# Patient Record
Sex: Female | Born: 1990 | Race: White | Hispanic: No | Marital: Single | Smoking: Current some day smoker
Health system: Southern US, Community
[De-identification: ages and names within clinical notes are randomized; demographics above are authoritative.]

## PROBLEM LIST (undated history)

## (undated) ENCOUNTER — Inpatient Hospital Stay (HOSPITAL_COMMUNITY): Payer: Self-pay

## (undated) DIAGNOSIS — J45909 Unspecified asthma, uncomplicated: Secondary | ICD-10-CM

## (undated) HISTORY — PX: WISDOM TOOTH EXTRACTION: SHX21

---
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
## 2011-04-05 LAB — POCT PREGNANCY, URINE: Preg Test, Ur: NEGATIVE

## 2011-04-05 LAB — CBC
MCV: 93.4
Platelets: 218
WBC: 6.2

## 2011-04-05 LAB — GC/CHLAMYDIA PROBE AMP, GENITAL
Chlamydia, DNA Probe: NEGATIVE
GC Probe Amp, Genital: NEGATIVE

## 2011-04-05 LAB — URINALYSIS, ROUTINE W REFLEX MICROSCOPIC
Bilirubin Urine: NEGATIVE
Hgb urine dipstick: NEGATIVE
Ketones, ur: 15 — AB
Protein, ur: NEGATIVE
Urobilinogen, UA: 0.2

## 2011-04-05 LAB — WET PREP, GENITAL

## 2011-04-13 LAB — POCT PREGNANCY, URINE: Preg Test, Ur: NEGATIVE

## 2013-06-05 ENCOUNTER — Emergency Department: Payer: Self-pay | Admitting: Emergency Medicine

## 2013-06-06 ENCOUNTER — Emergency Department: Payer: Self-pay | Admitting: Emergency Medicine

## 2014-02-04 ENCOUNTER — Encounter (HOSPITAL_COMMUNITY): Payer: Self-pay | Admitting: Emergency Medicine

## 2014-02-04 ENCOUNTER — Emergency Department (HOSPITAL_COMMUNITY): Payer: Self-pay

## 2014-02-04 ENCOUNTER — Emergency Department (HOSPITAL_COMMUNITY)
Admission: EM | Admit: 2014-02-04 | Discharge: 2014-02-05 | Disposition: A | Discharge status: Home / Self Care | Payer: Self-pay | Attending: Emergency Medicine | Admitting: Emergency Medicine

## 2014-02-04 DIAGNOSIS — O99330 Smoking (tobacco) complicating pregnancy, unspecified trimester: Secondary | ICD-10-CM | POA: Insufficient documentation

## 2014-02-04 DIAGNOSIS — N390 Urinary tract infection, site not specified: Secondary | ICD-10-CM | POA: Insufficient documentation

## 2014-02-04 DIAGNOSIS — O2390 Unspecified genitourinary tract infection in pregnancy, unspecified trimester: Secondary | ICD-10-CM | POA: Insufficient documentation

## 2014-02-04 DIAGNOSIS — O209 Hemorrhage in early pregnancy, unspecified: Secondary | ICD-10-CM | POA: Insufficient documentation

## 2014-02-04 LAB — URINALYSIS, ROUTINE W REFLEX MICROSCOPIC
BILIRUBIN URINE: NEGATIVE
Glucose, UA: NEGATIVE mg/dL
KETONES UR: NEGATIVE mg/dL
NITRITE: POSITIVE — AB
PH: 6 (ref 5.0–8.0)
PROTEIN: NEGATIVE mg/dL
Specific Gravity, Urine: 1.025 (ref 1.005–1.030)
UROBILINOGEN UA: 0.2 mg/dL (ref 0.0–1.0)

## 2014-02-04 LAB — CBC WITH DIFFERENTIAL/PLATELET
BASOS ABS: 0 10*3/uL (ref 0.0–0.1)
Basophils Relative: 0 % (ref 0–1)
EOS PCT: 3 % (ref 0–5)
Eosinophils Absolute: 0.3 10*3/uL (ref 0.0–0.7)
HEMATOCRIT: 39.8 % (ref 36.0–46.0)
HEMOGLOBIN: 13.8 g/dL (ref 12.0–15.0)
LYMPHS PCT: 36 % (ref 12–46)
Lymphs Abs: 3.3 10*3/uL (ref 0.7–4.0)
MCH: 32 pg (ref 26.0–34.0)
MCHC: 34.7 g/dL (ref 30.0–36.0)
MCV: 92.3 fL (ref 78.0–100.0)
MONO ABS: 0.5 10*3/uL (ref 0.1–1.0)
MONOS PCT: 5 % (ref 3–12)
NEUTROS ABS: 5.1 10*3/uL (ref 1.7–7.7)
Neutrophils Relative %: 56 % (ref 43–77)
Platelets: 305 10*3/uL (ref 150–400)
RBC: 4.31 MIL/uL (ref 3.87–5.11)
RDW: 13.3 % (ref 11.5–15.5)
WBC: 9.2 10*3/uL (ref 4.0–10.5)

## 2014-02-04 LAB — COMPREHENSIVE METABOLIC PANEL
ALT: 13 U/L (ref 0–35)
ANION GAP: 13 (ref 5–15)
AST: 10 U/L (ref 0–37)
Albumin: 4 g/dL (ref 3.5–5.2)
Alkaline Phosphatase: 73 U/L (ref 39–117)
BUN: 14 mg/dL (ref 6–23)
CALCIUM: 9.5 mg/dL (ref 8.4–10.5)
CHLORIDE: 104 meq/L (ref 96–112)
CO2: 25 meq/L (ref 19–32)
CREATININE: 0.78 mg/dL (ref 0.50–1.10)
GLUCOSE: 99 mg/dL (ref 70–99)
Potassium: 4.4 mEq/L (ref 3.7–5.3)
Sodium: 142 mEq/L (ref 137–147)
Total Protein: 7.2 g/dL (ref 6.0–8.3)

## 2014-02-04 LAB — POC URINE PREG, ED: PREG TEST UR: POSITIVE — AB

## 2014-02-04 LAB — WET PREP, GENITAL
Trich, Wet Prep: NONE SEEN
Yeast Wet Prep HPF POC: NONE SEEN

## 2014-02-04 LAB — HCG, QUANTITATIVE, PREGNANCY: HCG, BETA CHAIN, QUANT, S: 345 m[IU]/mL — AB (ref <5)

## 2014-02-04 LAB — URINE MICROSCOPIC-ADD ON

## 2014-02-04 LAB — ABO/RH: ABO/RH(D): A POS

## 2014-02-04 NOTE — ED Notes (Signed)
Pt states that she began having heavy vaginal bleeding on Saturday. Pt states she is passing blood clots and is saturating a pad ~ q 4h. Pt is concerned that she may be pregnant. States she hasn't had a period since June but has a hx of irregular periods.

## 2014-02-04 NOTE — ED Provider Notes (Signed)
CSN: [id number]     Arrival date & time 02/04/14  1949 History   First MD Initiated Contact with Patient 02/04/14 2106     Chief Complaint  Patient presents with  . Abdominal Pain  . Vaginal Bleeding     (Consider location/radiation/quality/duration/timing/severity/associated sxs/prior Treatment) The history is provided by the patient and medical records.   This is a 23 year old female presenting to the ED for heavy vaginal bleeding beginning 3 days ago. She states she is passing blood clots and is saturating through a pad approx every 4 hours. Her last menstrual period was in June, concern for possible pregnancy as she is not currently on any form of birth control. She does have a history of irregular periods as well as ovarian cysts.  Patient does have some right lower pelvic pain as well as some nausea and vomiting, none currently.  Denies recent vaginal discharge or urinary sx.  Pt is currently sexually active with one female partner.  Denies vaginal discharge.  No prior hx STD.  Patient is not currently established with an OB-GYN.  No prior abdominal surgeries.  History reviewed. No pertinent past medical history. History reviewed. No pertinent past surgical history. No family history on file. History  Substance Use Topics  . Smoking status: Current Every Day Smoker -- 1.00 packs/day    Types: Cigarettes  . Smokeless tobacco: Never Used  . Alcohol Use: No   OB History   Grav Para Term Preterm Abortions TAB SAB Ect Mult Living                 Review of Systems  Genitourinary: Positive for vaginal bleeding and pelvic pain.  All other systems reviewed and are negative.     Allergies  Ceclor  Home Medications   Prior to Admission medications   Not on File   BP 140/78  Pulse 117  Temp(Src) 98.3 F (36.8 C) (Oral)  Resp 16  Ht 5\' 4"  (1.626 m)  Wt 120 lb (54.432 kg)  BMI 20.59 kg/m2  SpO2 100%  LMP 12/03/2013  Physical Exam  Nursing note and vitals  reviewed. Constitutional: She is oriented to person, place, and time. She appears well-developed and well-nourished. No distress.  HENT:  Head: Normocephalic and atraumatic.  Mouth/Throat: Oropharynx is clear and moist.  Eyes: Conjunctivae and EOM are normal. Pupils are equal, round, and reactive to light.  Neck: Normal range of motion. Neck supple.  Cardiovascular: Normal rate, regular rhythm and normal heart sounds.   Pulmonary/Chest: Effort normal and breath sounds normal. No respiratory distress. She has no wheezes.  Abdominal: Soft. Bowel sounds are normal. There is no tenderness. There is no guarding.  Genitourinary: Cervix exhibits no motion tenderness and no friability. Right adnexum displays tenderness (mild). Left adnexum displays no tenderness. There is bleeding around the vagina. No vaginal discharge found.  Normal female external genitalia without visible lesions; cervical os closed, mild vaginal bleeding without visible clots; no appreciable vaginal discharge noted; mild right adnexa tenderness, left adnexa non-tender; no CMT or friability noted  Musculoskeletal: Normal range of motion.  Neurological: She is alert and oriented to person, place, and time.  Skin: Skin is warm and dry. She is not diaphoretic.  Psychiatric: She has a normal mood and affect.    ED Course  Procedures (including critical care time) Labs Review Labs Reviewed  WET PREP, GENITAL - Abnormal; Notable for the following:    Clue Cells Wet Prep HPF POC RARE (*)    WBC,  Wet Prep HPF POC FEW (*)    All other components within normal limits  COMPREHENSIVE METABOLIC PANEL - Abnormal; Notable for the following:    Total Bilirubin <0.2 (*)    All other components within normal limits  URINALYSIS, ROUTINE W REFLEX MICROSCOPIC - Abnormal; Notable for the following:    APPearance CLOUDY (*)    Hgb urine dipstick LARGE (*)    Nitrite POSITIVE (*)    Leukocytes, UA TRACE (*)    All other components within  normal limits  HCG, QUANTITATIVE, PREGNANCY - Abnormal; Notable for the following:    hCG, Beta Chain, Quant, S 345 (*)    All other components within normal limits  URINE MICROSCOPIC-ADD ON - Abnormal; Notable for the following:    Bacteria, UA MANY (*)    All other components within normal limits  POC URINE PREG, ED - Abnormal; Notable for the following:    Preg Test, Ur POSITIVE (*)    All other components within normal limits  GC/CHLAMYDIA PROBE AMP  URINE CULTURE  CBC WITH DIFFERENTIAL  HIV ANTIBODY (ROUTINE TESTING)  RPR  ABO/RH    Imaging Review US Ob Comp Less 14 Wks  02/05/2014   CLINICAL DATA:  Right adnexal pain and vaginal bleeding.  EXAM: OBSTETRIC <14 WK [place] AND TRANSVAGINAL OB US  TECHNIQUE: Both transabdominal and transvaginal ultrasound examinations were performed for complete evaluation of the gestation as well as the maternal uterus, adnexal regions, and pelvic cul-de-sac. Transvaginal technique was performed to assess early pregnancy.  COMPARISON:  None.  FINDINGS: Intrauterine gestational sac:  None seen.  Yolk sac:  N/A  Embryo:  N/A  Maternal uterus/adnexae: There is thickening of the endometrial echo complex at the lower uterine segment, measuring 7 mm. This may reflect a small amount of debris. Given the patient's continued vaginal bleeding, this could reflect sequelae of spontaneous abortion.  The ovaries are unremarkable in appearance. The right ovary measures 4.0 x 2.7 x 2.1 cm, while the left ovary measures 3.8 x 2.2 x 1.9 cm. No suspicious adnexal masses are seen; there is no evidence for ovarian torsion.  Trace fluid is noted at the right adnexa.  IMPRESSION: 1. No intrauterine gestational sac seen at this time. 2. Thickening of the endometrial echo complex at the lower uterine segment, measuring 7 mm. This may reflect a small amount of debris. Given continued vaginal bleeding, this could reflect sequelae of spontaneous abortion. Would consider follow-up of  quantitative beta-hCG levels, as deemed clinically appropriate.   Electronically Signed   By: Roanna Raider M.D.   On: 02/05/2014 00:40   US Ob Transvaginal  02/05/2014   CLINICAL DATA:  Right adnexal pain and vaginal bleeding.  EXAM: OBSTETRIC <14 WK [place] AND TRANSVAGINAL OB US  TECHNIQUE: Both transabdominal and transvaginal ultrasound examinations were performed for complete evaluation of the gestation as well as the maternal uterus, adnexal regions, and pelvic cul-de-sac. Transvaginal technique was performed to assess early pregnancy.  COMPARISON:  None.  FINDINGS: Intrauterine gestational sac:  None seen.  Yolk sac:  N/A  Embryo:  N/A  Maternal uterus/adnexae: There is thickening of the endometrial echo complex at the lower uterine segment, measuring 7 mm. This may reflect a small amount of debris. Given the patient's continued vaginal bleeding, this could reflect sequelae of spontaneous abortion.  The ovaries are unremarkable in appearance. The right ovary measures 4.0 x 2.7 x 2.1 cm, while the left ovary measures 3.8 x 2.2 x 1.9 cm. No suspicious  adnexal masses are seen; there is no evidence for ovarian torsion.  Trace fluid is noted at the right adnexa.  IMPRESSION: 1. No intrauterine gestational sac seen at this time. 2. Thickening of the endometrial echo complex at the lower uterine segment, measuring 7 mm. This may reflect a small amount of debris. Given continued vaginal bleeding, this could reflect sequelae of spontaneous abortion. Would consider follow-up of quantitative beta-hCG levels, as deemed clinically appropriate.   Electronically Signed   By: Roanna Raider M.D.   On: 02/05/2014 00:40     EKG Interpretation None      MDM   Final diagnoses:  Vaginal bleeding in pregnancy, first trimester  UTI (lower urinary tract infection)   23 y.o. F with vaginal bleeding.  Abdominal exam benign.  u-preg positive (G1P0).  On pelvic exam, she does have some mild bleeding as well as right adnexal  tenderness, cervical os closed at this time.  Gestational sac not visualized at this time, quant 345 today.  U/a nitrite +, culture pending.  Wet prep rare clue cells, do not feel this indicates true infection.  Gc/Chl, RPR, and HIV pending at this time.  Patient blood type A+, Rhogam not indicated.  Will discharge home with keflex for UTI.  Encouraged to start pre-natal vitamins.  [facility]  Discussed plan with patient, he/she acknowledged understanding and agreed with plan of care.  Return precautions given for new or worsening symptoms.  NOTE:  Delayed chart completion and discharge instructions done on paper forms due to EPIC down-time.  Garlon Hatchet, PA-C 02/05/14 1057

## 2014-02-04 NOTE — ED Notes (Signed)
US at bedside

## 2014-02-04 NOTE — ED Notes (Signed)
Pt states she started having heavy vaginal bleeding Saturday night, states having dark red blood clots or "skin" coming from vaginal area, states is not on birth control and not using condoms so is a chance she could be pregnant, states last menstrual cycle was 6/9, states does have irregular periods, states pain is better laying down, having RLQ pelvic pain, pt states is feeling weak, states weakness started last night, states last week had some vomiting in morning, denies diarrhea.

## 2014-02-05 LAB — GC/CHLAMYDIA PROBE AMP
CT Probe RNA: NEGATIVE
GC PROBE AMP APTIMA: NEGATIVE

## 2014-02-05 LAB — RPR

## 2014-02-05 LAB — HIV ANTIBODY (ROUTINE TESTING W REFLEX): HIV: NONREACTIVE

## 2014-02-07 LAB — URINE CULTURE

## 2014-02-07 NOTE — ED Provider Notes (Signed)
Medical screening examination/treatment/procedure(s) were performed by non-physician practitioner and as supervising physician I was immediately available for consultation/collaboration.   EKG Interpretation None        Mirian MoMatthew Gentry, MD 02/07/14 1451

## 2014-02-08 ENCOUNTER — Telehealth (HOSPITAL_BASED_OUTPATIENT_CLINIC_OR_DEPARTMENT_OTHER): Payer: Self-pay | Admitting: Emergency Medicine

## 2014-02-08 NOTE — Progress Notes (Signed)
ED Antimicrobial Stewardship Positive Culture Follow Up   Meredith Vega is an 23 y.o. female who presented to [facility] on 02/04[facility]2014 with a chief complaint of  Chief Complaint  Patient presents with  . Abdominal Pain  . Vaginal Bleeding    Recent Results (from the past 720 hour(s))  WET PREP, GENITAL     Status: Abnormal   Collection Time    02/04/14 10:10 PM      Result Value Ref Range Status   Yeast Wet Prep HPF POC NONE SEEN  NONE SEEN Final   Trich, Wet Prep NONE SEEN  NONE SEEN Final   Clue Cells Wet Prep HPF POC RARE (*) NONE SEEN Final   WBC, Wet Prep HPF POC FEW (*) NONE SEEN Final  GC/CHLAMYDIA PROBE AMP     Status: None   Collection Time    02/04/14 10:10 PM      Result Value Ref Range Status   CT Probe RNA NEGATIVE  NEGATIVE Final   GC Probe RNA NEGATIVE  NEGATIVE Final   Comment: (NOTE)                                                                                               **Normal Reference Range: Negative**          Assay performed using the Gen-Probe APTIMA COMBO2 (R) Assay.     Acceptable specimen types for this assay include APTIMA Swabs (Unisex,     endocervical, urethral, or vaginal), first void urine, and ThinPrep     liquid based cytology samples.     Performed at [facility]  URINE CULTURE     Status: None   Collection Time    02/04/14 10:19 PM      Result Value Ref Range Status   Specimen Description URINE, CLEAN CATCH   Final   Special Requests NONE   Final   Culture  Setup Time     Final   Value: 02/05/2014 05:57     Performed at [facility]   Colony Count     Final   Value: >=100,000 COLONIES/ML     Performed at [facility]   Culture     Final   Value: ESCHERICHIA COLI     Performed at [facility]   Report Status 02/07/2014 FINAL   Final   Organism ID, Bacteria ESCHERICHIA COLI   Final   Pt was dc on keflex for UTI during EPIC downtime. She does have a PCN allergy. Will change to cipro instead.    New antibiotic prescription:   Dc kelfex Cipro 500mg  PO BID x3 days  ED Provider: Anson FretMercedes Campribi, PA  Ulyses SouthwardMinh Pham, PharmD Pager: [phone] Infectious Diseases Pharmacist Phone# [phone]

## 2014-02-08 NOTE — Telephone Encounter (Signed)
Post ED Visit - Positive Culture Follow-up: Successful Patient Follow-Up  Culture assessed and recommendations reviewed by: []  Wes Dulaney, Pharm.D., BCPS []  Celedonio MiyamotoJeremy Frens, Pharm.D., BCPS []  Georgina PillionElizabeth Martin, Pharm.D., BCPS [x]  RadissonMinh Pham, [address] BoulevardPharm[address]D., BCPS, AAHIVP []  Estella HuskMichelle Turner, Pharm.D., BCPS, AAHIVP []  Red ChristiansSamson Lee, Pharm.D. []  Cassie Roseanne RenoStewart, [place]arm[place]D.  Positive urine culture  [x]  Patient discharged without antimicrobial prescription and treatment is now indicated []  Organism is resistant to prescribed ED discharge antimicrobial []  Patient with positive blood cultures  Changes discussed with ED provider: Allen DerryMercedes Camprubi-Soms PA-C New antibiotic prescription: If on Keflex, okay. If not, Cipro 500 mg PO BID x 3 days    Zeb ComfortHolland, Zenya Hickam 02/08/2014, 12:41 PM

## 2014-02-15 ENCOUNTER — Telehealth (HOSPITAL_BASED_OUTPATIENT_CLINIC_OR_DEPARTMENT_OTHER): Payer: Self-pay | Admitting: Emergency Medicine

## 2014-02-15 NOTE — Telephone Encounter (Signed)
ID verified, pt notified of + urine culture and need for changed in antitiotic treatment. RX Cipro called to [facility] [phone].

## 2016-02-09 ENCOUNTER — Emergency Department (HOSPITAL_COMMUNITY)
Admission: EM | Admit: 2016-02-09 | Discharge: 2016-02-09 | Disposition: A | Discharge status: Home / Self Care | Payer: Self-pay | Attending: Emergency Medicine | Admitting: Emergency Medicine

## 2016-02-09 ENCOUNTER — Encounter (HOSPITAL_COMMUNITY): Payer: Self-pay | Admitting: *Deleted

## 2016-02-09 DIAGNOSIS — F1910 Other psychoactive substance abuse, uncomplicated: Secondary | ICD-10-CM | POA: Insufficient documentation

## 2016-02-09 DIAGNOSIS — F1290 Cannabis use, unspecified, uncomplicated: Secondary | ICD-10-CM | POA: Insufficient documentation

## 2016-02-09 DIAGNOSIS — R45851 Suicidal ideations: Secondary | ICD-10-CM | POA: Insufficient documentation

## 2016-02-09 DIAGNOSIS — F17210 Nicotine dependence, cigarettes, uncomplicated: Secondary | ICD-10-CM | POA: Insufficient documentation

## 2016-02-09 LAB — COMPREHENSIVE METABOLIC PANEL
ALT: 21 U/L (ref 14–54)
AST: 18 U/L (ref 15–41)
Albumin: 4.3 g/dL (ref 3.5–5.0)
Alkaline Phosphatase: 69 U/L (ref 38–126)
Anion gap: 7 (ref 5–15)
BUN: 17 mg/dL (ref 6–20)
CO2: 23 mmol/L (ref 22–32)
Calcium: 9 mg/dL (ref 8.9–10.3)
Chloride: 107 mmol/L (ref 101–111)
Creatinine, Ser: 0.91 mg/dL (ref 0.44–1.00)
GFR calc Af Amer: >60 mL/min (ref >60)
GFR calc non Af Amer: >60 mL/min (ref >60)
Glucose, Bld: 88 mg/dL (ref 65–99)
Potassium: 3.7 mmol/L (ref 3.5–5.1)
Sodium: 137 mmol/L (ref 135–145)
Total Bilirubin: 0.4 mg/dL (ref 0.3–1.2)
Total Protein: 7.5 g/dL (ref 6.5–8.1)

## 2016-02-09 LAB — CBC
HCT: 38.1 % (ref 36.0–46.0)
Hemoglobin: 13.3 g/dL (ref 12.0–15.0)
MCH: 32.1 pg (ref 26.0–34.0)
MCHC: 34.9 g/dL (ref 30.0–36.0)
MCV: 92 fL (ref 78.0–100.0)
Platelets: 320 10*3/uL (ref 150–400)
RBC: 4.14 MIL/uL (ref 3.87–5.11)
RDW: 13.1 % (ref 11.5–15.5)
WBC: 14.5 10*3/uL — ABNORMAL HIGH (ref 4.0–10.5)

## 2016-02-09 LAB — ETHANOL: Alcohol, Ethyl (B): <5 mg/dL (ref <5)

## 2016-02-09 LAB — SALICYLATE LEVEL: Salicylate Lvl: <4 mg/dL (ref 2.8–30.0)

## 2016-02-09 LAB — ACETAMINOPHEN LEVEL: Acetaminophen (Tylenol), Serum: <10 ug/mL — ABNORMAL LOW (ref 10–30)
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
## 2016-02-09 NOTE — ED Provider Notes (Signed)
I assumed care of this patient from Dr. Burgess EstelleKonut at 1600.  Please see their note for further details of Hx, PE.  Briefly patient is a 25 y.o. female with a Suicidal Patient is seeking psychiatric evaluation. Per the primary providers know patient reported that she is "definately not suicidal" and that she is "trying to get my dad to pay attention to me." Pt was medically cleared and psych was consulted. They assessed the pt in the ED and cleared for DC.  Patient provided with substance abuse resources.   Safe for discharge.  Disposition: Discharge  Condition: Good  I have discussed the results, Dx and Tx plan with the patient who expressed understanding and agree(s) with the plan. Discharge instructions discussed at great length. The patient was given strict return precautions who verbalized understanding of the instructions. No further questions at time of discharge.        Nira ConnPedro Eduardo Sueo Cullen, MD 02/09/16 [phone]

## 2016-02-09 NOTE — BH Assessment (Addendum)
Tele Assessment Note   Meredith LenisHeather L Vega is a 25 y.o. female who [facility] accompanied by law enforcement due to suspected suicidality. Pt reports that her boyfriend was picked up today by law enforcement and, while there, they saw motel sticky notes on the table, where she had written "suicide notes" to various people on. Due to this discovery, pt was made to come in for an evaluation. Pt indicates that she wrote these little notes about an hour before law enforcement came. Pt states that she wrote the notes to get her boyfriend's attention, as she was "stressed to the max" with the fact that he would be going to jail for a long time. Pt adamantly denies SI, HI, AVH or any hx of either. Pt admits to daily drug use and indicates she doesn't want to quit. Pt shares that [place] [facility] [place] to fulfil her probation.   Diagnosis: Cocaine Use disorder, severe  Past Medical History: History reviewed. No pertinent past medical history.  History reviewed. No pertinent surgical history.  Family History: No family history on file.  Social History:  reports that she has been smoking Cigarettes.  She has been smoking about 1.00 pack per day. She has never used smokeless tobacco. She reports that she uses drugs, including Marijuana. She reports that she does not drink alcohol.  Additional Social History:  Alcohol / Drug Use Pain Medications: pt denies Prescriptions: pt denies Over the Counter: pt denies History of alcohol / drug use?: Yes Longest period of sobriety (when/how long): No period of sobriety. Pt doesn't want help to quit.  Substance #1 Name of Substance 1: marijuana & crack 1 - Age of First Use: 18 1 - Amount (size/oz): varies 1 - Frequency: daily 1 - Duration: ongoing 1 - Last Use / Amount: today  CIWA: CIWA-Ar BP: 141/88 Pulse Rate: 106 COWS:    PATIENT STRENGTHS: (choose at least two) Average or above average  intelligence Capable of independent living  Allergies:  Allergies  Allergen Reactions  . Ceclor [Cefaclor] Hives    Home Medications:  (Not in a hospital admission)  OB/GYN Status:  Patient's last menstrual period was 01/12/2016.  General Assessment Data Location of Assessment: [facility] TTS Assessment: In system Is this a Tele or Face-to-Face Assessment?: Tele Assessment Is this an Initial Assessment or a Re-assessment for this encounter?: Initial Assessment Marital status: Single Is patient pregnant?: No Pregnancy Status: No Living Arrangements: Spouse/significant other, Other (Comment) (in a motel) Can pt return to current living arrangement?: Yes Admission Status: Voluntary Is patient capable of signing voluntary admission?: Yes Referral Source: Self/Family/Friend Insurance type: none     Crisis Care Plan Living Arrangements: Spouse/significant other, Other (Comment) (in a motel) Name of Psychiatrist: none Name of Therapist: none  Education Status Is patient currently in school?: No  Risk to self with the past 6 months Suicidal Ideation: No Has patient been a risk to self within the past 6 months prior to admission? : No Suicidal Intent: No Has patient had any suicidal intent within the past 6 months prior to admission? : No Is patient at risk for suicide?: No Suicidal Plan?: No Has patient had any suicidal plan within the past 6 months prior to admission? : No Access to Means: No What has been your use of drugs/alcohol within the last 12 months?: see above Previous Attempts/Gestures: No Intentional Self Injurious Behavior: None Family Suicide History: Unknown Recent stressful life event(s): Other (  Comment) (boyfriend going to jail for an extended period of time) Persecutory voices/beliefs?: No Depression: Yes Substance abuse history and/or treatment for substance abuse?: No (no s/a treatment) Suicide prevention information given to non-admitted patients: Not  applicable  Risk to Others within the past 6 months Homicidal Ideation: No Does patient have any lifetime risk of violence toward others beyond the six months prior to admission? : No Thoughts of Harm to Others: No Current Homicidal Intent: No Current Homicidal Plan: No Access to Homicidal Means: No History of harm to others?: No Assessment of Violence: None Noted Does patient have access to weapons?: No Criminal Charges Pending?: No Does patient have a court date: No Is patient on probation?: Yes  Psychosis Hallucinations: None noted Delusions: None noted  Mental Status Report Appearance/Hygiene: Unremarkable Eye Contact: Good Motor Activity: Unremarkable Speech: Logical/coherent Level of Consciousness: Alert Mood: Pleasant Affect: Other (Comment) (Euthymic) Anxiety Level: None Thought Processes: Coherent, Relevant Judgement: Unimpaired Orientation: Person, Place, Time, Situation Obsessive Compulsive Thoughts/Behaviors: None  Cognitive Functioning Concentration: Normal Memory: Recent Intact, Remote Intact IQ: Average Insight: see judgement above Impulse Control: Good Appetite: Good Sleep: No Change Vegetative Symptoms: None  ADLScreening [facility] Assessment Services) Patient's cognitive ability adequate to safely complete daily activities?: Yes Patient able to express need for assistance with ADLs?: Yes Independently performs ADLs?: Yes (appropriate for developmental age)  Prior Inpatient Therapy Prior Inpatient Therapy: No  Prior Outpatient Therapy Prior Outpatient Therapy: No Does patient have an ACCT team?: No Does patient have Intensive In-House Services?  : No Does patient have [facility] services? : No Does patient have P4CC services?: No  ADL Screening (condition at time of admission) Patient's cognitive ability adequate to safely complete daily activities?: Yes Is the patient deaf or have difficulty hearing?: No Does the patient have difficulty seeing,  even when wearing glasses/contacts?: No Does the patient have difficulty concentrating, remembering, or making decisions?: No Patient able to express need for assistance with ADLs?: Yes Does the patient have difficulty dressing or bathing?: No Independently performs ADLs?: Yes (appropriate for developmental age) Does the patient have difficulty walking or climbing stairs?: No Weakness of Legs: None Weakness of Arms/Hands: None  Home Assistive Devices/Equipment Home Assistive Devices/Equipment: None  Therapy Consults (therapy consults require a physician order) PT Evaluation Needed: No OT Evalulation Needed: No SLP Evaluation Needed: No Abuse/Neglect Assessment (Assessment to be complete while patient is alone) Physical Abuse: Denies Verbal Abuse: Denies Sexual Abuse: Denies Exploitation of patient/patient's resources: Denies Self-Neglect: Denies Values / Beliefs Cultural Requests During Hospitalization: None Spiritual Requests During Hospitalization: None Consults Spiritual Care Consult Needed: No Social Work Consult Needed: No Merchant navy officerAdvance Directives (For Healthcare) Does patient have an advance directive?: No Would patient like information on creating an advanced directive?: No - patient declined information    Additional Information 1:1 In Past 12 Months?: No CIRT Risk: No Elopement Risk: No Does patient have medical clearance?: No     Disposition:  Disposition Initial Assessment Completed for this Encounter: Yes (consulted with Dr. Lucianne MussKumar) Disposition of Patient: Other dispositions (pt can be d/c with substance abuse referrals)  Laddie AquasSamantha M Kaenan Jake 02/09/2016 4:01 PM

## 2017-03-16 ENCOUNTER — Emergency Department (HOSPITAL_COMMUNITY): Payer: Self-pay

## 2017-03-16 ENCOUNTER — Emergency Department (HOSPITAL_COMMUNITY)
Admission: EM | Admit: 2017-03-16 | Discharge: 2017-03-16 | Disposition: A | Discharge status: Home / Self Care | Payer: Self-pay | Attending: Emergency Medicine | Admitting: Emergency Medicine

## 2017-03-16 DIAGNOSIS — J40 Bronchitis, not specified as acute or chronic: Secondary | ICD-10-CM

## 2017-03-16 DIAGNOSIS — R079 Chest pain, unspecified: Secondary | ICD-10-CM | POA: Insufficient documentation

## 2017-03-16 DIAGNOSIS — F17210 Nicotine dependence, cigarettes, uncomplicated: Secondary | ICD-10-CM | POA: Insufficient documentation

## 2017-03-16 LAB — BASIC METABOLIC PANEL
ANION GAP: 11 (ref 5–15)
BUN: 9 mg/dL (ref 6–20)
CO2: 21 mmol/L — ABNORMAL LOW (ref 22–32)
Calcium: 9.2 mg/dL (ref 8.9–10.3)
Chloride: 106 mmol/L (ref 101–111)
Creatinine, Ser: 0.83 mg/dL (ref 0.44–1.00)
Glucose, Bld: 141 mg/dL — ABNORMAL HIGH (ref 65–99)
Potassium: 3.6 mmol/L (ref 3.5–5.1)
Sodium: 138 mmol/L (ref 135–145)

## 2017-03-16 LAB — CBC
HCT: 41.1 % (ref 36.0–46.0)
Hemoglobin: 14.1 g/dL (ref 12.0–15.0)
MCH: 31.1 pg (ref 26.0–34.0)
MCHC: 34.3 g/dL (ref 30.0–36.0)
MCV: 90.5 fL (ref 78.0–100.0)
Platelets: 308 10*3/uL (ref 150–400)
RBC: 4.54 MIL/uL (ref 3.87–5.11)
RDW: 13.8 % (ref 11.5–15.5)
WBC: 18.8 10*3/uL — ABNORMAL HIGH (ref 4.0–10.5)

## 2017-03-16 LAB — I-STAT TROPONIN, ED: TROPONIN I, POC: 0 ng/mL (ref 0.00–0.08)

## 2017-03-16 MED ORDER — BENZONATATE 100 MG PO CAPS: 100.0000 mg | ORAL_CAPSULE | Freq: Three times a day (TID) | ORAL | 0 refills | Status: DC | PRN

## 2017-03-16 MED ORDER — ALBUTEROL SULFATE HFA 108 (90 BASE) MCG/ACT IN AERS
1.0000 | INHALATION_SPRAY | Freq: Once | RESPIRATORY_TRACT | Status: AC
  Administered 2017-03-16: 2 via RESPIRATORY_TRACT
  Filled 2017-03-16: qty 6.7

## 2017-03-16 MED ORDER — ALBUTEROL SULFATE (2.5 MG/3ML) 0.083% IN NEBU: 2.5000 mg | INHALATION_SOLUTION | Freq: Once | RESPIRATORY_TRACT | Status: DC

## 2017-03-16 MED ORDER — PREDNISONE 20 MG PO TABS: 40.0000 mg | ORAL_TABLET | Freq: Every day | ORAL | 0 refills | Status: DC

## 2017-03-16 MED ORDER — NAPROXEN 500 MG PO TABS: 500.0000 mg | ORAL_TABLET | Freq: Two times a day (BID) | ORAL | 0 refills | Status: DC | PRN

## 2017-03-16 MED ORDER — ALBUTEROL SULFATE (2.5 MG/3ML) 0.083% IN NEBU
INHALATION_SOLUTION | RESPIRATORY_TRACT | Status: AC
  Filled 2017-03-16: qty 3

## 2017-03-16 NOTE — ED Triage Notes (Signed)
Pt states for the last few days she has been having wheezing and feeling like she cant breath with sob, pt does have wheezing in the bases with labored breathing pt appears to be hyperventilating. Able to speak in complete sentences. Pt in curled in ina ball in triage chair not being very cooperative.

## 2017-03-16 NOTE — ED Provider Notes (Signed)
[facility]-EMERGENCY DEPT Provider Note   CSN: [id number] Arrival date & time: 03/16/17  1346     History   Chief Complaint Chief Complaint  Patient presents with  . Shortness of Breath    HPI Meredith Vega is a 26 y.o. female.  The history is provided by medical records. No language interpreter was used.  Shortness of Breath  Associated symptoms include cough and chest pain. Pertinent negatives include no leg swelling.   Meredith Vega is a 26 y.o. female who presents to the Emergency Department complaining of productive cough, congestion x 2 days with shortness of breath that developed this morning. Patient reports wheezing this morning as well. Nebulizer treatment given in triage and patient notes improvement following this. She endorses pain on both sides of her ribs and chest discomfort which he believes is secondary to coughing so much. No known sick contacts. She is a daily smoker. No recent surgeries/immobilizations/long travel. No hemoptysis. Not on OCPs.   No past medical history on file.  There are no active problems to display for this patient.   No past surgical history on file.  OB History    No data available       Home Medications    Prior to Admission medications   Medication Sig Start Date End Date Taking? Authorizing Provider  benzonatate (TESSALON) 100 MG capsule Take 1 capsule (100 mg total) by mouth 3 (three) times daily as needed for cough. 03/16/17   Meredith Vega, Chase Picket, PA-C  naproxen (NAPROSYN) 500 MG tablet Take 1 tablet (500 mg total) by mouth 2 (two) times daily as needed for moderate pain. 03/16/17   Meredith Vega, Chase Picket, PA-C  predniSONE (DELTASONE) 20 MG tablet Take 2 tablets (40 mg total) by mouth daily. 03/16/17   Meredith Vega, Chase Picket, PA-C    Family History No family history on file.  Social History Social History  Substance Use Topics  . Smoking status: Current Every Day Smoker    Packs/day: 1.00    Types: Cigarettes  . Smokeless  tobacco: Never Used  . Alcohol use No     Allergies   Ceclor [cefaclor]   Review of Systems Review of Systems  HENT: Positive for congestion. Negative for trouble swallowing.   Respiratory: Positive for cough and shortness of breath.   Cardiovascular: Positive for chest pain. Negative for palpitations and leg swelling.  All other systems reviewed and are negative.    Physical Exam Updated Vital Signs BP (!) 102/53 (BP Location: Left Arm)   Pulse (!) 104   Temp 98.4 F (36.9 C) (Oral)   Resp (!) 28   SpO2 94% Comment: While ambulating.  Physical Exam  Constitutional: She is oriented to person, place, and time. She appears well-developed and well-nourished. No distress.  HENT:  Head: Normocephalic and atraumatic.  Cardiovascular: Normal rate, regular rhythm and normal heart sounds.   No murmur heard. Pulmonary/Chest: Effort normal and breath sounds normal. No respiratory distress. She has no wheezes. She has no rales. She exhibits tenderness.  Abdominal: Soft. She exhibits no distension. There is no tenderness.  Musculoskeletal: She exhibits no edema.  Neurological: She is alert and oriented to person, place, and time.  Skin: Skin is warm and dry.  Nursing note and vitals reviewed.    ED Treatments / Results  Labs (all labs ordered are listed, but only abnormal results are displayed) Labs Reviewed  BASIC METABOLIC PANEL - Abnormal; Notable for the following:       Result  Value   CO2 21 (*)    Glucose, Bld 141 (*)    All other components within normal limits  CBC - Abnormal; Notable for the following:    WBC 18.8 (*)    All other components within normal limits  I-STAT TROPONIN, ED    EKG  EKG Interpretation None       Radiology Dg Chest 2 View  Result Date: 03/16/2017 CLINICAL DATA:  Shortness of breath, cough, left chest pain EXAM: CHEST  2 VIEW COMPARISON:  03/08/2017 FINDINGS: The heart size and mediastinal contours are within normal limits. Both  lungs are clear. The visualized skeletal structures are unremarkable. IMPRESSION: No active cardiopulmonary disease. Electronically Signed   By: Judie Petit.  Shick M.D.   On: 03/16/2017 15:01    Procedures Procedures (including critical care time)  Medications Ordered in ED Medications  albuterol (PROVENTIL) (2.5 MG/3ML) 0.083% nebulizer solution 2.5 mg (not administered)  albuterol (PROVENTIL HFA;VENTOLIN HFA) 108 (90 Base) MCG/ACT inhaler 1-2 puff (2 puffs Inhalation Given 03/16/17 1546)     Initial Impression / Assessment and Plan / ED Course  I have reviewed the triage vital signs and the nursing notes.  Pertinent labs & imaging results that were available during my care of the patient were reviewed by me and considered in my medical decision making (see chart for details).    Meredith Vega is a 26 y.o. female who presents to ED for cough, congestion x 2-3 days with shortness of breath and chest discomfort beginning today. Afebrile, hemodynamically stable. No hypoxia. No tachycardia initially or on exam. Following albuterol treatment, HR increased to 104 - likely 2/2 meds. PERC negative. Low risk heart score. Doubt ACS or PE etiology. Labs reviewed: she does have leukocytosis at 18.8. Lungs CTA and CXR negative. Exam performed after neb treatment in triage. Per nursing staff, she was wheezing prior. History and exam more c/w URI. Albuterol inhaler provided in ED. Smoking cessation discussed. Reasons to return to ER discussed with patient and significant other at bedside. Symptomatic home care instructions discussed as well. All questions answered.   Patient discussed with Dr. Patria Mane who agrees with treatment plan.   Final Clinical Impressions(s) / ED Diagnoses   Final diagnoses:  Bronchitis    New Prescriptions New Prescriptions   BENZONATATE (TESSALON) 100 MG CAPSULE    Take 1 capsule (100 mg total) by mouth 3 (three) times daily as needed for cough.   NAPROXEN (NAPROSYN) 500 MG TABLET     Take 1 tablet (500 mg total) by mouth 2 (two) times daily as needed for moderate pain.   PREDNISONE (DELTASONE) 20 MG TABLET    Take 2 tablets (40 mg total) by mouth daily.     Meredith Vega, Chase Picket, PA-C 03/16/17 Meredith Vega    Azalia Bilis, MD 03/17/17 1032

## 2017-03-16 NOTE — ED Notes (Signed)
PA-C at bedside 

## 2017-03-16 NOTE — ED Notes (Signed)
Patient received albuterol treatment in triage, reports feeling better afterward. Patient talking clear, in full sentences.

## 2017-03-16 NOTE — Discharge Instructions (Signed)
It was my pleasure taking care of you today!   Take prednisone daily.  Fortunately, we did not see evidence of serious infection and can treat your symptoms. Flonase and mucinex for nasal congestion, tessalon as needed for cough. Alternate between Tylenol and ibuprofen as needed for body aches / fevers.   Rest, drink plenty of fluids to be sure you are staying hydrated.   Please follow up with your primary doctor for discussion of your diagnoses and further evaluation after today's visit if symptoms persist longer than 7 days; Return to the ER for high fevers, difficulty breathing or other concerning symptoms

## 2017-03-16 NOTE — ED Notes (Signed)
Patient verbalized understanding of discharge instructions and denies any further needs or questions at this time. VS stable. Patient ambulatory with steady gait, escorted to ED entrance in wheelchair.   

## 2017-03-16 NOTE — ED Notes (Signed)
Patient transported to xray via wheelchair.

## 2017-06-27 NOTE — L&D Delivery Note (Addendum)
Patient: Meredith Vega MRN: [id number]  GBS status: Unconfirmed   Patient is a 27 y.o. now G1P1001 s/p NSVD at [redacted]w[redacted]d, who was admitted for IOL due to gHTN. SROM 3h 51m prior to delivery with clear fluid.    Delivery Note At 5:07 PM a viable female was delivered via Vaginal, Spontaneous (Presentation: left OA).  APGAR: 8, 9; weight 6 lb 7.7 oz (2940 g).   Placenta status: Spontaneous, intact Cord: 3 vessel intact with the following complications: compound arm.    Anesthesia: Epidural   Episiotomy: None Lacerations: 1st degree;Labial Suture Repair: None  Est. Blood Loss (mL): 123  Head delivered left OA. No nuchal cord present. Shoulder and body delivered in usual fashion, noted a compound arm. Infant with spontaneous cry, placed on mother's abdomen, dried and bulb suctioned. Cord clamped x 2 after 1-minute delay, and cut by family member. Cord blood drawn. Placenta delivered spontaneously with gentle cord traction. Fundus firm with massage and Pitocin. Perineum inspected and found to have a superficial labial and small 1st degree laceration, which were found to be hemostatic.  Mom to postpartum.  Baby to Couplet care / Skin to Skin.  Allayne Stack 04/25/2018, 5:50 PM   OB FELLOW DELIVERY ATTESTATION  I was gloved and present for the delivery in its entirety, and I agree with the above resident's note.    Marcy Siren, D.O. OB Fellow  04/26/2018, 12:40 PM

## 2018-02-22 ENCOUNTER — Inpatient Hospital Stay (HOSPITAL_COMMUNITY)
Admission: AD | Admit: 2018-02-22 | Discharge: 2018-02-22 | Disposition: A | Discharge status: Home / Self Care | Payer: Medicaid Other | Source: Ambulatory Visit | Attending: Obstetrics and Gynecology | Admitting: Obstetrics and Gynecology

## 2018-02-22 ENCOUNTER — Encounter (HOSPITAL_COMMUNITY): Payer: Self-pay | Admitting: *Deleted

## 2018-02-22 DIAGNOSIS — Z3201 Encounter for pregnancy test, result positive: Secondary | ICD-10-CM | POA: Diagnosis not present

## 2018-02-22 DIAGNOSIS — Z3402 Encounter for supervision of normal first pregnancy, second trimester: Secondary | ICD-10-CM | POA: Insufficient documentation

## 2018-02-22 DIAGNOSIS — Z3490 Encounter for supervision of normal pregnancy, unspecified, unspecified trimester: Secondary | ICD-10-CM

## 2018-02-22 NOTE — Discharge Instructions (Signed)

## 2018-02-22 NOTE — MAU Provider Note (Signed)
S:  Meredith Vega is a 27 y.o. female G1P0 @ 1078w2d here in MAU for a pregnancy verification letter. She is needing a verification letter stating fetal viability. No problems or complaints. Is starting outpatient treatment for opiate addiction.    O:  Blood pressure 116/74, pulse 92, temperature 98 F (36.7 C), temperature source Oral, resp. rate 16, height 5\' 5"  (1.651 m), weight 68.2 kg, last menstrual period 10/03/2017, SpO2 98 %.  GENERAL: Well-developed, well-nourished female in no acute distress.  LUNGS: Effort normal SKIN: Warm, dry and without erythema PSYCH: Normal mood and affect   A:  1. Presence of fetal heart sounds, antepartum     P:  Pregnancy verification letter given + fetal heart tones heard Keep your appointment on 9/16   Meredith Vega, Hilma Steinhilber I, NP 02/22/2018 4:35 PM

## 2018-02-22 NOTE — MAU Note (Signed)
[facility], needs proof of viability for drug treatment.

## 2018-02-22 NOTE — MAU Note (Signed)
Urine in lab 

## 2018-03-12 ENCOUNTER — Ambulatory Visit (INDEPENDENT_AMBULATORY_CARE_PROVIDER_SITE_OTHER): Payer: Medicaid Other | Admitting: Obstetrics and Gynecology

## 2018-03-12 ENCOUNTER — Encounter: Payer: Self-pay | Admitting: Obstetrics

## 2018-03-12 ENCOUNTER — Encounter: Payer: Self-pay | Admitting: Obstetrics and Gynecology

## 2018-03-12 ENCOUNTER — Other Ambulatory Visit (HOSPITAL_COMMUNITY)
Admission: RE | Admit: 2018-03-12 | Discharge: 2018-03-12 | Disposition: A | Discharge status: Home / Self Care | Payer: Medicaid Other | Source: Ambulatory Visit | Attending: Obstetrics and Gynecology | Admitting: Obstetrics and Gynecology

## 2018-03-12 DIAGNOSIS — Z3402 Encounter for supervision of normal first pregnancy, second trimester: Secondary | ICD-10-CM

## 2018-03-12 DIAGNOSIS — O0990 Supervision of high risk pregnancy, unspecified, unspecified trimester: Secondary | ICD-10-CM | POA: Insufficient documentation

## 2018-03-12 DIAGNOSIS — F1910 Other psychoactive substance abuse, uncomplicated: Secondary | ICD-10-CM | POA: Insufficient documentation

## 2018-03-12 DIAGNOSIS — Z3400 Encounter for supervision of normal first pregnancy, unspecified trimester: Secondary | ICD-10-CM | POA: Diagnosis present

## 2018-03-12 NOTE — Patient Instructions (Signed)

## 2018-03-12 NOTE — Progress Notes (Signed)
Patient is in the office for initial ob visit. 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 Pt states that she does not have money for transportation [facility].

## 2018-03-12 NOTE — Progress Notes (Signed)
Subjective:  Meredith Vega is a 27 y.o. G1P0 at 607w6d being seen today for her first OB visit. EDD by certain LMP. No chronic medical problems or medications. H/O daily IV drug abuse.  She is currently monitored for the following issues for this high-risk pregnancy and has Supervision of normal first pregnancy, antepartum and Drug abuse (HCC) on their problem list.  Patient reports no complaints.  Contractions: Not present. Vag. Bleeding: None.  Movement: Present. Denies leaking of fluid.   The following portions of the patient's history were reviewed and updated as appropriate: allergies, current medications, past family history, past medical history, past social history, past surgical history and problem list. Problem list updated.  Objective:   Vitals:   03/12/18 1316  BP: 128/84  Pulse: 96  Weight: 159 lb (72.1 kg)    Fetal Status: Fetal Heart Rate (bpm): 160   Movement: Present     General:  Alert, oriented and cooperative. Patient is in no acute distress.  Skin: Skin is warm and dry. No rash noted.   Cardiovascular: Normal heart rate noted  Respiratory: Normal respiratory effort, no problems with respiration noted  Abdomen: Soft, gravid, appropriate for gestational age. Pain/Pressure: Absent     Pelvic:  Cervical exam performed        Extremities: Normal range of motion.  Edema: None  Mental Status: Normal mood and affect. Normal behavior. Normal judgment and thought content.   Urinalysis:      Assessment and Plan:  Pregnancy: G1P0 at 167w6d  1. Supervision of normal first pregnancy, antepartum Prenatal care and labs reviewed with pt.  Declines Flu vaccine and Genetic testing Tdap today Drug abuse reviewed with pt. Will have pt see social worker today  Preterm labor symptoms and general obstetric precautions including but not limited to vaginal bleeding, contractions, leaking of fluid and fetal movement were reviewed in detail with the patient. Please refer to After  Visit Summary for other counseling recommendations.  No follow-ups on file.   Hermina StaggersErvin, Meredith Peeler L, MD

## 2018-03-13 LAB — OBSTETRIC PANEL, INCLUDING HIV
Antibody Screen: NEGATIVE
Basophils Absolute: 0 10*3/uL (ref 0.0–0.2)
Basos: 0 %
EOS (ABSOLUTE): 0.5 10*3/uL — ABNORMAL HIGH (ref 0.0–0.4)
Eos: 4 %
HEMATOCRIT: 37.9 % (ref 34.0–46.6)
HEMOGLOBIN: 12.5 g/dL (ref 11.1–15.9)
HEP B S AG: NEGATIVE
HIV Screen 4th Generation wRfx: NONREACTIVE
IMMATURE GRANULOCYTES: 1 %
Immature Grans (Abs): 0.1 10*3/uL (ref 0.0–0.1)
LYMPHS: 20 %
Lymphocytes Absolute: 2.6 10*3/uL (ref 0.7–3.1)
MCH: 31.4 pg (ref 26.6–33.0)
MCHC: 33 g/dL (ref 31.5–35.7)
MCV: 95 fL (ref 79–97)
MONOCYTES: 6 %
Monocytes Absolute: 0.8 10*3/uL (ref 0.1–0.9)
Neutrophils Absolute: 8.8 10*3/uL — ABNORMAL HIGH (ref 1.4–7.0)
Neutrophils: 69 %
Platelets: 284 10*3/uL (ref 150–450)
RBC: 3.98 x10E6/uL (ref 3.77–5.28)
RDW: 14.5 % (ref 12.3–15.4)
RPR: NONREACTIVE
RUBELLA: 5.24 {index} (ref >0.99)
Rh Factor: POSITIVE
WBC: 12.8 10*3/uL — ABNORMAL HIGH (ref 3.4–10.8)

## 2018-03-13 LAB — CYTOLOGY - PAP
Chlamydia: NEGATIVE
DIAGNOSIS: NEGATIVE
NEISSERIA GONORRHEA: NEGATIVE

## 2018-03-15 LAB — HEMOGLOBINOPATHY EVALUATION
HEMOGLOBIN A2 QUANTITATION: 2.4 % (ref 1.8–3.2)
HEMOGLOBIN F QUANTITATION: 0 % (ref 0.0–2.0)
HGB A: 97.6 % (ref 96.4–98.8)
HGB C: 0 %
HGB S: 0 %
HGB VARIANT: 0 %

## 2018-03-16 LAB — URINE CULTURE, OB REFLEX

## 2018-03-16 LAB — CULTURE, OB URINE

## 2018-03-18 ENCOUNTER — Other Ambulatory Visit: Payer: Self-pay

## 2018-03-18 ENCOUNTER — Emergency Department (HOSPITAL_COMMUNITY)
Admission: EM | Admit: 2018-03-18 | Discharge: 2018-03-18 | Disposition: A | Discharge status: Home / Self Care | Payer: Medicaid Other | Attending: Emergency Medicine | Admitting: Emergency Medicine

## 2018-03-18 ENCOUNTER — Encounter (HOSPITAL_COMMUNITY): Payer: Self-pay | Admitting: Emergency Medicine

## 2018-03-18 DIAGNOSIS — F17210 Nicotine dependence, cigarettes, uncomplicated: Secondary | ICD-10-CM | POA: Insufficient documentation

## 2018-03-18 DIAGNOSIS — J4521 Mild intermittent asthma with (acute) exacerbation: Secondary | ICD-10-CM | POA: Diagnosis not present

## 2018-03-18 DIAGNOSIS — R062 Wheezing: Secondary | ICD-10-CM | POA: Diagnosis present

## 2018-03-18 MED ORDER — LORATADINE 10 MG PO TABS
10.0000 mg | ORAL_TABLET | Freq: Once | ORAL | Status: AC
  Administered 2018-03-18: 10 mg via ORAL
  Filled 2018-03-18: qty 1

## 2018-03-18 MED ORDER — SODIUM CHLORIDE 0.9 % IV BOLUS: 1000.0000 mL | Freq: Once | INTRAVENOUS | Status: DC

## 2018-03-18 MED ORDER — SODIUM CHLORIDE 0.9 % IV BOLUS
1000.0000 mL | Freq: Once | INTRAVENOUS | Status: AC
  Administered 2018-03-18: 1000 mL via INTRAVENOUS

## 2018-03-18 MED ORDER — CETIRIZINE HCL 10 MG PO TABS: 10.0000 mg | ORAL_TABLET | Freq: Every day | ORAL | 0 refills | Status: DC

## 2018-03-18 MED ORDER — ALBUTEROL SULFATE HFA 108 (90 BASE) MCG/ACT IN AERS: 1.0000 | INHALATION_SPRAY | Freq: Four times a day (QID) | RESPIRATORY_TRACT | 0 refills | Status: AC | PRN

## 2018-03-18 MED ORDER — PREDNISONE 20 MG PO TABS
40.0000 mg | ORAL_TABLET | ORAL | Status: AC
  Administered 2018-03-18: 40 mg via ORAL
  Filled 2018-03-18: qty 2

## 2018-03-18 MED ORDER — PREDNISONE 20 MG PO TABS: ORAL_TABLET | ORAL | 0 refills | Status: DC

## 2018-03-18 NOTE — ED Triage Notes (Addendum)
Pt arriving via [facility] for [facility] from home. Pt has hx of asthma, reports she has not had an inhaler for a while. Pt has chronic cough. Wheezing were present prior to arrival. Pt received 10mg  Albuterol and 0.5mg  Atrovent. Wheezing no present at this time. Pt admits to smoking daily.

## 2018-03-18 NOTE — ED Notes (Signed)
Bed: RESB Expected date:  Expected time:  Means of arrival:  Comments: 27 yo F/Asthma

## 2018-03-18 NOTE — ED Provider Notes (Signed)
[facility]-EMERGENCY DEPT Provider Note   CSN: [id number] Arrival date & time: 03/18/18  0448     History   Chief Complaint Chief Complaint  Patient presents with  . Shortness of Breath    HPI Meredith Vega is a 27 y.o. female.  The history is provided by the patient.  Wheezing   This is a recurrent problem. The current episode started 2 days ago. The problem occurs constantly. The problem has not changed since onset.Pertinent negatives include no chest pain, no fever, no abdominal pain, no rhinorrhea, no hemoptysis, no sputum production and no rash. The problem's precipitants include smoke. She has tried nothing for the symptoms. The treatment provided no relief. She has had no prior ICU admissions. Her past medical history is significant for asthma. Her past medical history does not include PE.  Patient with PMH significant for asthma and is 23 and 5/7 pregnant presents with wheezing.  Has been out of her inhaler for sometime.  She is still smoking on a daily basis at least a pack a day.  Received 10 of albuterol and atrovent en route by ems for wheezing.     History reviewed. No pertinent past medical history.  Patient Active Problem List   Diagnosis Date Noted  . Supervision of normal first pregnancy, antepartum 03/12/2018  . Drug abuse (HCC) 03/12/2018    History reviewed. No pertinent surgical history.   OB History    Gravida  1   Para      Term      Preterm      AB      Living        SAB      TAB      Ectopic      Multiple      Live Births               Home Medications    Prior to Admission medications   Medication Sig Start Date End Date Taking? Authorizing Provider  Prenatal Vit-Fe Fumarate-FA (PRENATAL MULTIVITAMIN) TABS tablet Take 1 tablet by mouth daily at 12 noon.    [provider]    Family History No family history on file.  Social History Social History   Tobacco Use  . Smoking status:  Current Some Day Smoker    Packs/day: 1.00    Types: Cigarettes  . Smokeless tobacco: Never Used  Substance Use Topics  . Alcohol use: No  . Drug use: Yes    Types: Marijuana, Heroin     Allergies   Ceclor [cefaclor]   Review of Systems Review of Systems  Constitutional: Negative for diaphoresis and fever.  HENT: Negative for rhinorrhea.   Respiratory: Positive for wheezing. Negative for hemoptysis and sputum production.   Cardiovascular: Negative for chest pain, palpitations and leg swelling.  Gastrointestinal: Negative for abdominal pain, nausea and rectal pain.  Genitourinary: Negative for pelvic pain, vaginal bleeding, vaginal discharge and vaginal pain.  Skin: Negative for rash.  All other systems reviewed and are negative.    Physical Exam Updated Vital Signs BP (!) 126/99   Pulse (!) 106   Temp 97.8 F (36.6 C) (Oral)   Resp 17   LMP 10/03/2017   SpO2 100%   Physical Exam  Constitutional: She is oriented to person, place, and time. She appears well-developed and well-nourished. No distress.  HENT:  Head: Normocephalic and atraumatic.  Mouth/Throat: No oropharyngeal exudate.  Eyes: Pupils are equal, round, and reactive to  light. Conjunctivae are normal.  Neck: Normal range of motion. Neck supple.  Cardiovascular: Normal rate, regular rhythm, normal heart sounds and intact distal pulses.  Pulmonary/Chest: Effort normal and breath sounds normal. No stridor. No respiratory distress. She has no rales.  Abdominal: Soft. Bowel sounds are normal. She exhibits no mass. There is no tenderness. There is no rebound and no guarding.  Musculoskeletal: Normal range of motion. She exhibits no edema or tenderness.  Neurological: She is alert and oriented to person, place, and time. She displays normal reflexes.  Skin: Skin is warm and dry. Capillary refill takes less than 2 seconds.  Psychiatric: She has a normal mood and affect.  Nursing note and vitals reviewed.    ED  Treatments / Results  Labs (all labs ordered are listed, but only abnormal results are displayed) Labs Reviewed - No data to display  EKG None  Radiology No results found.  Procedures Procedures (including critical care time)  Medications Ordered in ED Medications  sodium chloride 0.9 % bolus 1,000 mL (1,000 mLs Intravenous New Bag/Given 03/18/18 0516)   Case d/w Dr. Vicente SereneAyanwu of [facility] to give steroids and claritin.  Follow up in clinic.    Final Clinical Impressions(s) / ED Diagnoses    Return for fevers >100.4 unrelieved by medication, shortness of breath, intractable vomiting, or diarrhea, Inability to tolerate liquids or food, cough, altered mental status or any concerns. No signs of systemic illness or infection. The patient is nontoxic-appearing on exam and vital signs are within normal limits.   I have reviewed the triage vital signs and the nursing notes. Pertinent labs &imaging results that were available during my care of the patient were reviewed by me and considered in my medical decision making (see chart for details).  After history, exam, and medical workup I feel the patient has been appropriately medically screened and is safe for discharge home. Pertinent diagnoses were discussed with the patient. Patient was given return precautions.   Yaacov Koziol, MD 03/18/18 [phone]

## 2018-03-18 NOTE — ED Notes (Signed)
ED Provider at bedside. 

## 2018-03-21 LAB — TOXASSURE SELECT 13 (MW), URINE

## 2018-03-30 NOTE — Progress Notes (Signed)
CSW A. Linton Rump met with patient and father of baby to discuss active heroin use during pregnancy. Meredith Vega reports she snork heroin. Meredith Vega reports she lives with a friend and does not have secure means for transportation. CSW A. Linton Rump provided Meredith Vega with a task for applying for pregnancy medicaid and completing transportation request.

## 2018-04-09 ENCOUNTER — Encounter (HOSPITAL_COMMUNITY): Payer: Self-pay

## 2018-04-09 ENCOUNTER — Ambulatory Visit (INDEPENDENT_AMBULATORY_CARE_PROVIDER_SITE_OTHER): Payer: Medicaid Other | Admitting: Obstetrics & Gynecology

## 2018-04-09 VITALS — BP 138/78 | HR 96 | Wt 168.0 lb

## 2018-04-09 DIAGNOSIS — F1910 Other psychoactive substance abuse, uncomplicated: Secondary | ICD-10-CM

## 2018-04-09 DIAGNOSIS — F1410 Cocaine abuse, uncomplicated: Secondary | ICD-10-CM | POA: Insufficient documentation

## 2018-04-09 DIAGNOSIS — O2342 Unspecified infection of urinary tract in pregnancy, second trimester: Secondary | ICD-10-CM | POA: Diagnosis not present

## 2018-04-09 DIAGNOSIS — O0992 Supervision of high risk pregnancy, unspecified, second trimester: Secondary | ICD-10-CM | POA: Diagnosis not present

## 2018-04-09 DIAGNOSIS — O99321 Drug use complicating pregnancy, first trimester: Secondary | ICD-10-CM | POA: Diagnosis not present

## 2018-04-09 DIAGNOSIS — O2340 Unspecified infection of urinary tract in pregnancy, unspecified trimester: Secondary | ICD-10-CM

## 2018-04-09 DIAGNOSIS — F1121 Opioid dependence, in remission: Secondary | ICD-10-CM | POA: Insufficient documentation

## 2018-04-09 DIAGNOSIS — F1110 Opioid abuse, uncomplicated: Secondary | ICD-10-CM | POA: Insufficient documentation

## 2018-04-09 DIAGNOSIS — O99320 Drug use complicating pregnancy, unspecified trimester: Secondary | ICD-10-CM

## 2018-04-09 MED ORDER — NITROFURANTOIN MONOHYD MACRO 100 MG PO CAPS: 100.0000 mg | ORAL_CAPSULE | Freq: Two times a day (BID) | ORAL | 1 refills | Status: DC

## 2018-04-09 NOTE — Patient Instructions (Addendum)
Return to clinic for any scheduled appointments or obstetric concerns, or go to MAU for evaluation  TDaP Vaccine Pregnancy Get the Whooping Cough Vaccine While You Are Pregnant (CDC)  It is important for women to get the whooping cough vaccine in the third trimester of each pregnancy. Vaccines are the best way to prevent this disease. There are 2 different whooping cough vaccines. Both vaccines combine protection against whooping cough, tetanus and diphtheria, but they are for different age groups: Tdap: for everyone 11 years or older, including pregnant women  DTaP: for children 2 months through 6 years of age  You need the whooping cough vaccine during each of your pregnancies The recommended time to get the shot is during your 27th through 36th week of pregnancy, preferably during the earlier part of this time period. The Centers for Disease Control and Prevention (CDC) recommends that pregnant women receive the whooping cough vaccine for adolescents and adults (called Tdap vaccine) during the third trimester of each pregnancy. The recommended time to get the shot is during your 27th through 36th week of pregnancy, preferably during the earlier part of this time period. This replaces the original recommendation that pregnant women get the vaccine only if they had not previously received it. The American College of Obstetricians and Gynecologists and the American College of Nurse-Midwives support this recommendation.  You should get the whooping cough vaccine while pregnant to pass protection to your baby frame support disabled and/or not supported in this browser  Learn why Meredith Vega decided to get the whooping cough vaccine in her 3rd trimester of pregnancy and how her baby girl was born with some protection against the disease. Also available on YouTube. After receiving the whooping cough vaccine, your body will create protective antibodies (proteins produced by the body to fight off diseases) and  pass some of them to your baby before birth. These antibodies provide your baby some short-term protection against whooping cough in early life. These antibodies can also protect your baby from some of the more serious complications that come along with whooping cough. Your protective antibodies are at their highest about 2 weeks after getting the vaccine, but it takes time to pass them to your baby. So the preferred time to get the whooping cough vaccine is early in your third trimester. The amount of whooping cough antibodies in your body decreases over time. That is why CDC recommends you get a whooping cough vaccine during each pregnancy. Doing so allows each of your babies to get the greatest number of protective antibodies from you. This means each of your babies will get the best protection possible against this disease.  Getting the whooping cough vaccine while pregnant is better than getting the vaccine after you give birth Whooping cough vaccination during pregnancy is ideal so your baby will have short-term protection as soon as he is born. This early protection is important because your baby will not start getting his whooping cough vaccines until he is 2 months old. These first few months of life are when your baby is at greatest risk for catching whooping cough. This is also when he's at greatest risk for having severe, potentially life-threating complications from the infection. To avoid that gap in protection, it is best to get a whooping cough vaccine during pregnancy. You will then pass protection to your baby before he is born. To continue protecting your baby, he should get whooping cough vaccines starting at 2 months old. You may never have gotten the   Tdap vaccine before and did not get it during this pregnancy. If so, you should make sure to get the vaccine immediately after you give birth, before leaving the hospital or birthing center. It will take about 2 weeks before your body  develops protection (antibodies) in response to the vaccine. Once you have protection from the vaccine, you are less likely to give whooping cough to your newborn while caring for him. But remember, your baby will still be at risk for catching whooping cough from others. A recent study looked to see how effective Tdap was at preventing whooping cough in babies whose mothers got the vaccine while pregnant or in the hospital after giving birth. The study found that getting Tdap between 27 through 36 weeks of pregnancy is 85% more effective at preventing whooping cough in babies younger than 2 months old. Blood tests cannot tell if you need a whooping cough vaccine There are no blood tests that can tell you if you have enough antibodies in your body to protect yourself or your baby against whooping cough. Even if you have been sick with whooping cough in the past or previously received the vaccine, you still should get the vaccine during each pregnancy. Breastfeeding may pass some protective antibodies onto your baby By breastfeeding, you may pass some antibodies you have made in response to the vaccine to your baby. When you get a whooping cough vaccine during your pregnancy, you will have antibodies in your breast milk that you can share with your baby as soon as your milk comes in. However, your baby will not get protective antibodies immediately if you wait to get the whooping cough vaccine until after delivering your baby. This is because it takes about 2 weeks for your body to create antibodies. Learn more about the health benefits of breastfeeding. 

## 2018-04-09 NOTE — Addendum Note (Signed)
Addended by: Jaynie Collins A on: 04/09/2018 02:52 PM   Modules accepted: Orders

## 2018-04-09 NOTE — Progress Notes (Signed)
   PRENATAL VISIT NOTE  Subjective:  Meredith Vega is a 27 y.o. G1P0 at [redacted]w[redacted]d being seen today for ongoing prenatal care.  She is currently monitored for the following issues for this high-risk pregnancy and has Supervision of high-risk pregnancy; Drug abuse (HCC); Heroin abuse affecting pregnancy (HCC); Cocaine abuse affecting pregnancy (HCC); and Opioid abuse (HCC) on their problem list.  Patient reports having BLE edema for a few days. No headaches, visual symptoms, RUQ/epigastric pain.  Contractions: Not present. Vag. Bleeding: None.  Movement: Present. Denies leaking of fluid.   The following portions of the patient's history were reviewed and updated as appropriate: allergies, current medications, past family history, past medical history, past social history, past surgical history and problem list. Problem list updated.  Objective:   Vitals:   04/09/18 1417  BP: 138/78  Pulse: 96  Weight: 168 lb (76.2 kg)    Fetal Status: Fetal Heart Rate (bpm): 157 Fundal Height: 26 cm Movement: Present     General:  Alert, oriented and cooperative. Patient is in no acute distress.  Skin: Skin is warm and dry. No rash noted.   Cardiovascular: Normal heart rate noted  Respiratory: Normal respiratory effort, no problems with respiration noted  Abdomen: Soft, gravid, appropriate for gestational age.  Pain/Pressure: Absent     Pelvic: Cervical exam deferred        Extremities: Normal range of motion.  Edema: Mild pitting, slight indentation  Mental Status: Normal mood and affect. Normal behavior. Normal judgment and thought content.   Assessment and Plan:  Pregnancy: G1P0 at [redacted]w[redacted]d  1. Substance abuse affecting pregnancy, antepartum UDS positive for cocaine, heroin , opioids last visit. She reports that she stopped using all substances.  Followed by SW. - [place] [facility] OB DETAIL +14 WK; Future  2. Urinary tract infection in mother during pregnancy, antepartum Did not fill Rx as prescribed, repeat  culture done. Regimen changed to Macrobid based on sensitivities and severe allergy to Cephalosporin. - Culture, OB Urine - nitrofurantoin, macrocrystal-monohydrate, (MACROBID) 100 MG capsule; Take 1 capsule (100 mg total) by mouth 2 (two) times daily.  Dispense: 14 capsule; Refill: 1  3. Supervision of high risk pregnancy in second trimester Has BLE edema, borderline BP. Will check labs. - CBC - Comprehensive metabolic panel - [place] [facility] OB DETAIL +14 WK; Future - Protein / creatinine ratio, urine  Preterm labor symptoms and general obstetric precautions including but not limited to vaginal bleeding, contractions, leaking of fluid and fetal movement were reviewed in detail with the patient. Please refer to After Visit Summary for other counseling recommendations.  Return in about 2 weeks (around 04/23/2018) for 2 hr GTT, 3rd trimester labs, TDap, OB Visit.    Jaynie Collins, MD

## 2018-04-10 LAB — COMPREHENSIVE METABOLIC PANEL
A/G RATIO: 1.3 (ref 1.2–2.2)
ALT: 12 IU/L (ref 0–32)
AST: 12 IU/L (ref 0–40)
Albumin: 3.6 g/dL (ref 3.5–5.5)
Alkaline Phosphatase: 156 IU/L — ABNORMAL HIGH (ref 39–117)
BUN / CREAT RATIO: 14 (ref 9–23)
BUN: 10 mg/dL (ref 6–20)
Bilirubin Total: <0.2 mg/dL (ref 0.0–1.2)
CALCIUM: 9.7 mg/dL (ref 8.7–10.2)
CO2: 19 mmol/L — ABNORMAL LOW (ref 20–29)
Chloride: 105 mmol/L (ref 96–106)
Creatinine, Ser: 0.72 mg/dL (ref 0.57–1.00)
GFR, EST AFRICAN AMERICAN: 132 mL/min/{1.73_m2} (ref >59)
GFR, EST NON AFRICAN AMERICAN: 114 mL/min/{1.73_m2} (ref >59)
GLOBULIN, TOTAL: 2.7 g/dL (ref 1.5–4.5)
Glucose: 102 mg/dL — ABNORMAL HIGH (ref 65–99)
POTASSIUM: 4.3 mmol/L (ref 3.5–5.2)
SODIUM: 141 mmol/L (ref 134–144)
TOTAL PROTEIN: 6.3 g/dL (ref 6.0–8.5)

## 2018-04-10 LAB — CBC
HEMATOCRIT: 35.2 % (ref 34.0–46.6)
Hemoglobin: 11.8 g/dL (ref 11.1–15.9)
MCH: 31.1 pg (ref 26.6–33.0)
MCHC: 33.5 g/dL (ref 31.5–35.7)
MCV: 93 fL (ref 79–97)
Platelets: 232 10*3/uL (ref 150–450)
RBC: 3.8 x10E6/uL (ref 3.77–5.28)
RDW: 13.6 % (ref 12.3–15.4)
WBC: 11.7 10*3/uL — AB (ref 3.4–10.8)

## 2018-04-10 LAB — PROTEIN / CREATININE RATIO, URINE
Creatinine, Urine: 169 mg/dL
Protein, Ur: 40 mg/dL
Protein/Creat Ratio: 237 mg/g creat — ABNORMAL HIGH (ref 0–200)

## 2018-04-13 LAB — URINE CULTURE, OB REFLEX

## 2018-04-13 LAB — CULTURE, OB URINE

## 2018-04-17 ENCOUNTER — Encounter (HOSPITAL_COMMUNITY): Payer: Self-pay

## 2018-04-17 ENCOUNTER — Other Ambulatory Visit: Payer: Self-pay | Admitting: Obstetrics & Gynecology

## 2018-04-17 ENCOUNTER — Ambulatory Visit (HOSPITAL_COMMUNITY)
Admission: RE | Admit: 2018-04-17 | Discharge: 2018-04-17 | Disposition: A | Discharge status: Home / Self Care | Payer: Medicaid Other | Source: Ambulatory Visit | Attending: Obstetrics & Gynecology | Admitting: Obstetrics & Gynecology

## 2018-04-17 DIAGNOSIS — O0992 Supervision of high risk pregnancy, unspecified, second trimester: Secondary | ICD-10-CM

## 2018-04-17 DIAGNOSIS — O358XX0 Maternal care for other (suspected) fetal abnormality and damage, not applicable or unspecified: Secondary | ICD-10-CM

## 2018-04-17 DIAGNOSIS — O0993 Supervision of high risk pregnancy, unspecified, third trimester: Secondary | ICD-10-CM | POA: Insufficient documentation

## 2018-04-17 DIAGNOSIS — O99320 Drug use complicating pregnancy, unspecified trimester: Secondary | ICD-10-CM

## 2018-04-17 DIAGNOSIS — O0933 Supervision of pregnancy with insufficient antenatal care, third trimester: Secondary | ICD-10-CM | POA: Diagnosis not present

## 2018-04-17 DIAGNOSIS — Z3A37 37 weeks gestation of pregnancy: Secondary | ICD-10-CM | POA: Insufficient documentation

## 2018-04-17 DIAGNOSIS — O99323 Drug use complicating pregnancy, third trimester: Secondary | ICD-10-CM

## 2018-04-17 HISTORY — DX: Unspecified asthma, uncomplicated: J45.909

## 2018-04-18 ENCOUNTER — Other Ambulatory Visit (HOSPITAL_COMMUNITY): Payer: Self-pay | Admitting: *Deleted

## 2018-04-18 ENCOUNTER — Telehealth: Payer: Self-pay

## 2018-04-18 DIAGNOSIS — F1910 Other psychoactive substance abuse, uncomplicated: Secondary | ICD-10-CM

## 2018-04-18 NOTE — Telephone Encounter (Signed)
Returned call and confirmed that u/s dating is [redacted]w[redacted]d today.Advised pt of appt time changes to include glucose testing in her next visit.

## 2018-04-24 ENCOUNTER — Other Ambulatory Visit: Payer: Self-pay

## 2018-04-24 ENCOUNTER — Encounter (HOSPITAL_COMMUNITY): Payer: Self-pay | Admitting: *Deleted

## 2018-04-24 ENCOUNTER — Encounter: Payer: Self-pay | Admitting: Obstetrics and Gynecology

## 2018-04-24 ENCOUNTER — Encounter (HOSPITAL_COMMUNITY): Payer: Self-pay

## 2018-04-24 ENCOUNTER — Ambulatory Visit (HOSPITAL_COMMUNITY): Admission: RE | Admit: 2018-04-24 | Payer: Medicaid Other | Source: Ambulatory Visit

## 2018-04-24 ENCOUNTER — Inpatient Hospital Stay (HOSPITAL_COMMUNITY)
Admission: AD | Admit: 2018-04-24 | Discharge: 2018-04-27 | DRG: 806 | Disposition: A | Discharge status: Home / Self Care | Payer: Medicaid Other | Attending: Family Medicine | Admitting: Family Medicine

## 2018-04-24 ENCOUNTER — Ambulatory Visit (HOSPITAL_COMMUNITY)
Admission: RE | Admit: 2018-04-24 | Discharge: 2018-04-24 | Disposition: A | Discharge status: Home / Self Care | Payer: Medicaid Other | Source: Ambulatory Visit | Attending: Obstetrics & Gynecology | Admitting: Obstetrics & Gynecology

## 2018-04-24 DIAGNOSIS — F1410 Cocaine abuse, uncomplicated: Secondary | ICD-10-CM | POA: Diagnosis present

## 2018-04-24 DIAGNOSIS — F1910 Other psychoactive substance abuse, uncomplicated: Secondary | ICD-10-CM | POA: Diagnosis present

## 2018-04-24 DIAGNOSIS — O134 Gestational [pregnancy-induced] hypertension without significant proteinuria, complicating childbirth: Principal | ICD-10-CM | POA: Diagnosis present

## 2018-04-24 DIAGNOSIS — O163 Unspecified maternal hypertension, third trimester: Secondary | ICD-10-CM | POA: Insufficient documentation

## 2018-04-24 DIAGNOSIS — Z3A38 38 weeks gestation of pregnancy: Secondary | ICD-10-CM

## 2018-04-24 DIAGNOSIS — O326XX0 Maternal care for compound presentation, not applicable or unspecified: Secondary | ICD-10-CM | POA: Diagnosis present

## 2018-04-24 DIAGNOSIS — O358XX0 Maternal care for other (suspected) fetal abnormality and damage, not applicable or unspecified: Secondary | ICD-10-CM

## 2018-04-24 DIAGNOSIS — F1190 Opioid use, unspecified, uncomplicated: Secondary | ICD-10-CM | POA: Diagnosis not present

## 2018-04-24 DIAGNOSIS — Q614 Renal dysplasia: Secondary | ICD-10-CM | POA: Insufficient documentation

## 2018-04-24 DIAGNOSIS — O99334 Smoking (tobacco) complicating childbirth: Secondary | ICD-10-CM | POA: Diagnosis present

## 2018-04-24 DIAGNOSIS — J45909 Unspecified asthma, uncomplicated: Secondary | ICD-10-CM | POA: Diagnosis present

## 2018-04-24 DIAGNOSIS — F1290 Cannabis use, unspecified, uncomplicated: Secondary | ICD-10-CM | POA: Diagnosis present

## 2018-04-24 DIAGNOSIS — O4103X0 Oligohydramnios, third trimester, not applicable or unspecified: Secondary | ICD-10-CM | POA: Diagnosis present

## 2018-04-24 DIAGNOSIS — F1110 Opioid abuse, uncomplicated: Secondary | ICD-10-CM | POA: Diagnosis present

## 2018-04-24 DIAGNOSIS — O99323 Drug use complicating pregnancy, third trimester: Secondary | ICD-10-CM | POA: Diagnosis not present

## 2018-04-24 DIAGNOSIS — O99324 Drug use complicating childbirth: Secondary | ICD-10-CM | POA: Diagnosis present

## 2018-04-24 DIAGNOSIS — O99320 Drug use complicating pregnancy, unspecified trimester: Secondary | ICD-10-CM

## 2018-04-24 DIAGNOSIS — Z79891 Long term (current) use of opiate analgesic: Secondary | ICD-10-CM

## 2018-04-24 DIAGNOSIS — O0933 Supervision of pregnancy with insufficient antenatal care, third trimester: Secondary | ICD-10-CM | POA: Diagnosis not present

## 2018-04-24 DIAGNOSIS — O9952 Diseases of the respiratory system complicating childbirth: Secondary | ICD-10-CM | POA: Diagnosis present

## 2018-04-24 DIAGNOSIS — F1121 Opioid dependence, in remission: Secondary | ICD-10-CM | POA: Diagnosis present

## 2018-04-24 DIAGNOSIS — F17210 Nicotine dependence, cigarettes, uncomplicated: Secondary | ICD-10-CM | POA: Diagnosis present

## 2018-04-24 DIAGNOSIS — Z5181 Encounter for therapeutic drug level monitoring: Secondary | ICD-10-CM

## 2018-04-24 DIAGNOSIS — Z79899 Other long term (current) drug therapy: Secondary | ICD-10-CM

## 2018-04-24 LAB — CBC
HCT: 34 % — ABNORMAL LOW (ref 36.0–46.0)
HEMOGLOBIN: 11.7 g/dL — AB (ref 12.0–15.0)
MCH: 32 pg (ref 26.0–34.0)
MCHC: 34.4 g/dL (ref 30.0–36.0)
MCV: 92.9 fL (ref 80.0–100.0)
Platelets: 258 10*3/uL (ref 150–400)
RBC: 3.66 MIL/uL — AB (ref 3.87–5.11)
RDW: 14.9 % (ref 11.5–15.5)
WBC: 13.5 10*3/uL — ABNORMAL HIGH (ref 4.0–10.5)
nRBC: 0 % (ref 0.0–0.2)

## 2018-04-24 LAB — CBC WITH DIFFERENTIAL/PLATELET
Basophils Absolute: 0 10*3/uL (ref 0.0–0.1)
Basophils Relative: 0 %
Eosinophils Absolute: 0.3 10*3/uL (ref 0.0–0.5)
Eosinophils Relative: 2 %
HCT: 34.2 % — ABNORMAL LOW (ref 36.0–46.0)
Hemoglobin: 11.6 g/dL — ABNORMAL LOW (ref 12.0–15.0)
LYMPHS ABS: 2.8 10*3/uL (ref 0.7–4.0)
LYMPHS PCT: 20 %
MCH: 31.4 pg (ref 26.0–34.0)
MCHC: 33.9 g/dL (ref 30.0–36.0)
MCV: 92.4 fL (ref 80.0–100.0)
MONO ABS: 0.3 10*3/uL (ref 0.1–1.0)
Monocytes Relative: 2 %
NEUTROS ABS: 10.7 10*3/uL — AB (ref 1.7–7.7)
Neutrophils Relative %: 76 %
OTHER: 0 %
PLATELETS: 262 10*3/uL (ref 150–400)
RBC: 3.7 MIL/uL — AB (ref 3.87–5.11)
RDW: 14.8 % (ref 11.5–15.5)
WBC: 14.1 10*3/uL — AB (ref 4.0–10.5)
nRBC: 0 % (ref 0.0–0.2)

## 2018-04-24 LAB — URINALYSIS, ROUTINE W REFLEX MICROSCOPIC
BILIRUBIN URINE: NEGATIVE
Glucose, UA: NEGATIVE mg/dL
Hgb urine dipstick: NEGATIVE
KETONES UR: NEGATIVE mg/dL
Nitrite: NEGATIVE
PROTEIN: NEGATIVE mg/dL
Specific Gravity, Urine: 1.019 (ref 1.005–1.030)
pH: 6 (ref 5.0–8.0)

## 2018-04-24 LAB — RAPID URINE DRUG SCREEN, HOSP PERFORMED
Amphetamines: NOT DETECTED
Barbiturates: NOT DETECTED
Benzodiazepines: NOT DETECTED
COCAINE: POSITIVE — AB
OPIATES: POSITIVE — AB
Tetrahydrocannabinol: POSITIVE — AB

## 2018-04-24 LAB — COMPREHENSIVE METABOLIC PANEL
ALK PHOS: 131 U/L — AB (ref 38–126)
ALT: 15 U/L (ref 0–44)
AST: 16 U/L (ref 15–41)
Albumin: 3 g/dL — ABNORMAL LOW (ref 3.5–5.0)
Anion gap: 10 (ref 5–15)
BUN: 11 mg/dL (ref 6–20)
CALCIUM: 8.8 mg/dL — AB (ref 8.9–10.3)
CHLORIDE: 104 mmol/L (ref 98–111)
CO2: 22 mmol/L (ref 22–32)
CREATININE: 0.76 mg/dL (ref 0.44–1.00)
Glucose, Bld: 93 mg/dL (ref 70–99)
Potassium: 4 mmol/L (ref 3.5–5.1)
Sodium: 136 mmol/L (ref 135–145)
Total Bilirubin: 0.3 mg/dL (ref 0.3–1.2)
Total Protein: 6.7 g/dL (ref 6.5–8.1)

## 2018-04-24 LAB — PROTEIN / CREATININE RATIO, URINE
CREATININE, URINE: 153 mg/dL
Protein Creatinine Ratio: 0.12 mg/mg{Cre} (ref 0.00–0.15)
TOTAL PROTEIN, URINE: 18 mg/dL

## 2018-04-24 LAB — TYPE AND SCREEN
ABO/RH(D): A POS
Antibody Screen: NEGATIVE

## 2018-04-24 LAB — ABO/RH: ABO/RH(D): A POS

## 2018-04-24 MED ORDER — FENTANYL CITRATE (PF) 100 MCG/2ML IJ SOLN
INTRAMUSCULAR | Status: AC
  Administered 2018-04-24: 100 ug
  Filled 2018-04-24: qty 2

## 2018-04-24 MED ORDER — OXYTOCIN 40 UNITS IN LACTATED RINGERS INFUSION - SIMPLE MED
2.5000 [IU]/h | INTRAVENOUS | Status: DC
  Filled 2018-04-24: qty 1000

## 2018-04-24 MED ORDER — MISOPROSTOL 50MCG HALF TABLET
50.0000 ug | ORAL_TABLET | ORAL | Status: DC | PRN
  Administered 2018-04-24: 50 ug via ORAL
  Filled 2018-04-24: qty 1

## 2018-04-24 MED ORDER — SOD CITRATE-CITRIC ACID 500-334 MG/5ML PO SOLN: 30.0000 mL | ORAL | Status: DC | PRN

## 2018-04-24 MED ORDER — FENTANYL CITRATE (PF) 100 MCG/2ML IJ SOLN
100.0000 ug | INTRAMUSCULAR | Status: DC | PRN
  Administered 2018-04-24 – 2018-04-25 (×4): 100 ug via INTRAVENOUS
  Filled 2018-04-24 (×5): qty 2

## 2018-04-24 MED ORDER — TERBUTALINE SULFATE 1 MG/ML IJ SOLN: 0.2500 mg | Freq: Once | INTRAMUSCULAR | Status: DC | PRN

## 2018-04-24 MED ORDER — FLEET ENEMA 7-19 GM/118ML RE ENEM: 1.0000 | ENEMA | RECTAL | Status: DC | PRN

## 2018-04-24 MED ORDER — ONDANSETRON HCL 4 MG/2ML IJ SOLN: 4.0000 mg | Freq: Four times a day (QID) | INTRAMUSCULAR | Status: DC | PRN

## 2018-04-24 MED ORDER — NICOTINE 14 MG/24HR TD PT24
14.0000 mg | MEDICATED_PATCH | Freq: Every day | TRANSDERMAL | Status: DC
  Administered 2018-04-24 – 2018-04-27 (×4): 14 mg via TRANSDERMAL
  Filled 2018-04-24 (×6): qty 1

## 2018-04-24 MED ORDER — LACTATED RINGERS IV SOLN
INTRAVENOUS | Status: DC
  Administered 2018-04-24 – 2018-04-25 (×4): via INTRAVENOUS

## 2018-04-24 MED ORDER — OXYTOCIN BOLUS FROM INFUSION
500.0000 mL | Freq: Once | INTRAVENOUS | Status: AC
  Administered 2018-04-25: 500 mL via INTRAVENOUS

## 2018-04-24 MED ORDER — LIDOCAINE HCL (PF) 1 % IJ SOLN
30.0000 mL | INTRAMUSCULAR | Status: DC | PRN
  Filled 2018-04-24: qty 30

## 2018-04-24 MED ORDER — ACETAMINOPHEN 325 MG PO TABS: 650.0000 mg | ORAL_TABLET | ORAL | Status: DC | PRN

## 2018-04-24 MED ORDER — LACTATED RINGERS IV SOLN: 500.0000 mL | INTRAVENOUS | Status: DC | PRN

## 2018-04-24 NOTE — MAU Note (Signed)
Not in lobby

## 2018-04-24 NOTE — MAU Provider Note (Signed)
Chief Complaint: Hypertension   First Provider Initiated Contact with Patient 04/24/18 1604      SUBJECTIVE HPI: Meredith Vega is a 27 y.o. G1P0 who presents to maternity admissions for evaluation of elevated blood pressures. She was seen in MFM and found to have elevated pressures but denies headache, visual changes or epigastric pain. She reports normal fetal movement. Denies leaking or bleeding.   She has a history of drug abuse but reports last cocaine use in September and [facility] last night.    Past Medical History:  Diagnosis Date  . Asthma    Past Surgical History:  Procedure Laterality Date  . WISDOM TOOTH EXTRACTION     Social History   Socioeconomic History  . Marital status: Single    Spouse name: Not on file  . Number of children: Not on file  . Years of education: Not on file  . Highest education level: Not on file  Occupational History  . Not on file  Social Needs  . Financial resource strain: Not on file  . Food insecurity:    Worry: Not on file    Inability: Not on file  . Transportation needs:    Medical: Not on file    Non-medical: Not on file  Tobacco Use  . Smoking status: Current Some Day Smoker    Packs/day: 1.00    Types: Cigarettes  . Smokeless tobacco: Never Used  Substance and Sexual Activity  . Alcohol use: No  . Drug use: Yes    Types: Marijuana, Heroin, Cocaine    Comment: None since September 2019  . Sexual activity: Yes    Birth control/protection: None  Lifestyle  . Physical activity:    Days per week: Not on file    Minutes per session: Not on file  . Stress: Not on file  Relationships  . Social connections:    Talks on phone: Not on file    Gets together: Not on file    Attends religious service: Not on file    Active member of club or organization: Not on file    Attends meetings of clubs or organizations: Not on file    Relationship status: Not on file  . Intimate partner violence:    Fear of current or ex partner: Not  on file    Emotionally abused: Not on file    Physically abused: Not on file    Forced sexual activity: Not on file  Other Topics Concern  . Not on file  Social History Narrative  . Not on file   No current facility-administered medications on file prior to encounter.    Current Outpatient Medications on File Prior to Encounter  Medication Sig Dispense Refill  . albuterol (PROVENTIL HFA;VENTOLIN HFA) 108 (90 Base) MCG/ACT inhaler Inhale 1-2 puffs into the lungs every 6 (six) hours as needed for wheezing or shortness of breath. 1 Inhaler 0  . nitrofurantoin, macrocrystal-monohydrate, (MACROBID) 100 MG capsule Take 1 capsule (100 mg total) by mouth 2 (two) times daily. 14 capsule 1  . Prenatal Vit-Fe Fumarate-FA (PRENATAL MULTIVITAMIN) TABS tablet Take 1 tablet by mouth daily at 12 noon.    . cetirizine (ZYRTEC ALLERGY) 10 MG tablet Take 1 tablet (10 mg total) by mouth daily. (Patient not taking: Reported on 04/17/2018) 30 tablet 0  . predniSONE (DELTASONE) 20 MG tablet 2 tabs po daily x 3 days (Patient not taking: Reported on 04/17/2018) 6 tablet 0   Allergies  Allergen Reactions  . Ceclor [Cefaclor]  Hives    ROS:  Review of Systems  Constitutional: Negative.  Negative for fatigue and fever.  HENT: Negative.   Eyes: Negative for visual disturbance.  Respiratory: Negative.  Negative for shortness of breath.   Cardiovascular: Negative.  Negative for chest pain.  Gastrointestinal: Negative.  Negative for abdominal pain, nausea and vomiting.  Genitourinary: Negative.  Negative for dysuria, vaginal bleeding and vaginal discharge.  Neurological: Negative.  Negative for dizziness and headaches.    I have reviewed patient's Past Medical Hx, Surgical Hx, Family Hx, Social Hx, medications and allergies.   Physical Exam   Patient Vitals for the past 24 hrs:  BP Pulse  04/24/18 1616 (!) 124/95 95  04/24/18 1601 (!) 128/108 96  04/24/18 1559 (!) 142/85 92   Physical Exam  Nursing  note and vitals reviewed. Constitutional: She is oriented to person, place, and time. She appears well-developed and well-nourished. No distress.  HENT:  Head: Normocephalic.  Eyes: Pupils are equal, round, and reactive to light.  Cardiovascular: Normal rate, regular rhythm and normal heart sounds.  Respiratory: Effort normal and breath sounds normal. No respiratory distress.  GI: Soft. Bowel sounds are normal. She exhibits no distension. There is no tenderness.  Neurological: She is alert and oriented to person, place, and time.  Skin: Skin is warm and dry.  Psychiatric: She has a normal mood and affect. Her behavior is normal. Judgment and thought content normal.   Fetal Tracing:  Baseline: 130 Variability: moderate Accels: 15x15 Decels: none  Toco: occasional uc's   MDM Consulted with Dr. Adrian Blackwater- patient to be admitted for induction due to gestational hypertension.  ASSESSMENT MSE Complete  PLAN - Admit to birthing suites - Report called to Dr. Marylouise Stacks, Elisha Headland, CNM 04/24/2018 4:31 PM

## 2018-04-24 NOTE — MAU Note (Signed)
Pt presents from MFM with elevated b/p today. Denies headache, denies other symptoms. Reports good fetal movement

## 2018-04-24 NOTE — H&P (Addendum)
LABOR AND DELIVERY ADMISSION HISTORY AND PHYSICAL NOTE  Meredith Vega is a 27 y.o. female G1P0 with IUP at [redacted]w[redacted]d by 37 wk U/S presenting for IOL due to gHTN and oligohydramnios noted while at MFM today. BPP today 8/8.  She reports positive fetal movement. She denies leakage of fluid or vaginal bleeding.  Prenatal History/Complications: PNC at [facility]. Pregnancy complications:  - Late to prenatal care (established at 22 weeks, only had 2 prenatal visits total)  - Cocaine, heroin, and THC use affecting pregnancy (UDS positive for cocaine, opiates, THC today)     -She states she last used cocaine in August. Snorts heroin everyday-to every other day. Uses   THC irregularly. Last used heroin last night at 9:30pm and smoked MJ joint.  - Tobacco use during pregnancy- previously 1 PPD, cut back to 1/2 PPD once found out she was pregnant. Desires a nicotine patch, jittery currently.  - Left multicystic dysplastic kidney seen on fetal U/S  - Oligohydramnios (AFI 4.5 in BPP today)  - gHTN, diagnosed today  Past Medical History: Past Medical History:  Diagnosis Date  . Asthma     Past Surgical History: Past Surgical History:  Procedure Laterality Date  . WISDOM TOOTH EXTRACTION      Obstetrical History: OB History    Gravida  1   Para      Term      Preterm      AB      Living  0     SAB      TAB      Ectopic      Multiple      Live Births              Social History: Social History   Socioeconomic History  . Marital status: Single    Spouse name: Not on file  . Number of children: Not on file  . Years of education: Not on file  . Highest education level: Not on file  Occupational History  . Not on file  Social Needs  . Financial resource strain: Not on file  . Food insecurity:    Worry: Not on file    Inability: Not on file  . Transportation needs:    Medical: Not on file    Non-medical: Not on file  Tobacco Use  . Smoking status: Current Some Day  Smoker    Packs/day: 1.00    Types: Cigarettes  . Smokeless tobacco: Never Used  Substance and Sexual Activity  . Alcohol use: No  . Drug use: Yes    Types: Marijuana, Heroin, Cocaine    Comment: None since September 2019  . Sexual activity: Yes    Birth control/protection: None  Lifestyle  . Physical activity:    Days per week: Not on file    Minutes per session: Not on file  . Stress: Not on file  Relationships  . Social connections:    Talks on phone: Not on file    Gets together: Not on file    Attends religious service: Not on file    Active member of club or organization: Not on file    Attends meetings of clubs or organizations: Not on file    Relationship status: Not on file  Other Topics Concern  . Not on file  Social History Narrative  . Not on file    Family History: Family History  Problem Relation Age of Onset  . Cirrhosis Mother  Allergies: Allergies  Allergen Reactions  . Ceclor [Cefaclor] Hives    Medications Prior to Admission  Medication Sig Dispense Refill Last Dose  . albuterol (PROVENTIL HFA;VENTOLIN HFA) 108 (90 Base) MCG/ACT inhaler Inhale 1-2 puffs into the lungs every 6 (six) hours as needed for wheezing or shortness of breath. 1 Inhaler 0 04/24/2018 at Unknown time  . nitrofurantoin, macrocrystal-monohydrate, (MACROBID) 100 MG capsule Take 1 capsule (100 mg total) by mouth 2 (two) times daily. 14 capsule 1 04/23/2018 at Unknown time  . Prenatal Vit-Fe Fumarate-FA (PRENATAL MULTIVITAMIN) TABS tablet Take 1 tablet by mouth daily at 12 noon.   04/23/2018 at Unknown time  . cetirizine (ZYRTEC ALLERGY) 10 MG tablet Take 1 tablet (10 mg total) by mouth daily. (Patient not taking: Reported on 04/17/2018) 30 tablet 0 Not Taking at Unknown time  . predniSONE (DELTASONE) 20 MG tablet 2 tabs po daily x 3 days (Patient not taking: Reported on 04/17/2018) 6 tablet 0 Completed Course at Unknown time     Review of Systems  All systems reviewed and  negative except as stated in HPI  Physical Exam Blood pressure (!) 124/95, pulse 95, last menstrual period 10/03/2017. General appearance: alert, oriented, NAD Lungs: normal respiratory effort Heart: regular rate Abdomen: soft, non-tender; gravid, FH appropriate for GA Extremities: No calf swelling or tenderness Presentation: cephalic Fetal monitoring: baseline 130s, mod var, + accel, - decels  Uterine activity: Irregular, irritable  Dilation: 1 Effacement (%): 50 Cervical Position: Middle Station: -2 Presentation: Vertex Exam by:: [place] rn    Prenatal labs: ABO, Rh: A/Positive/-- (09/16 1438) Antibody: Negative (09/16 1438) Rubella: 5.24 (09/16 1438) RPR: Non Reactive (09/16 1438)  HBsAg: Negative (09/16 1438)  HIV: Non Reactive (09/16 1438)  GC/Chlamydia: Negative  GBS:   Negative  2-hr GTT: Not done  Genetic screening: Declined  Anatomy US: At 37 weeks, normal with exception of multicystic dysplastic left kidney (R normal)  EFW: 3121 gm on 04/17/2018  Prenatal Transfer Tool  Maternal Diabetes: Not screened  Genetic Screening: Declined Maternal Ultrasounds/Referrals: Normal Fetal Ultrasounds or other Referrals:  None Maternal Substance Abuse:  Yes:  Type: Marijuana, Cocaine, Other: Heroin  Significant Maternal Medications:  None Significant Maternal Lab Results: Lab values include: Group B Strep negative  No results found for this or any previous visit (from the past 24 hour(s)).  Patient Active Problem List   Diagnosis Date Noted  . Heroin abuse affecting pregnancy (HCC) 04/09/2018  . Cocaine abuse affecting pregnancy (HCC) 04/09/2018  . Opioid abuse (HCC) 04/09/2018  . Supervision of high-risk pregnancy 03/12/2018  . Drug abuse (HCC) 03/12/2018    Assessment: Meredith Vega is a 27 y.o. G1P0 at [redacted]w[redacted]d here for IOL due to [facility] and oligohydramnios. Pregnancy complicated by late to prenatal care (established at 22 weeks, only had 2 prenatal visits total),  Cocaine/heroin/opioid/THC use affecting pregnancy, left multicystic dysplastic kidney seen on fetal U/S, oligohydramnios (AFI 4.5), and gHTN. GBS negative.   #Labor: IOL with cytotec x1. Consideration for placing FB soon.  #Pain: Considering epidural  #FWB: Cat 1 strip  #ID: GBS negative  #MOF: Bottle feeding  #MOC: Undecided, will discuss options further  #Circ: N/A  1. GHTN: Stable, mild to moderate range. Last BP 124/95. Asymptomatic. PIH labs pending.   -Monitor BP, IV labetalol for SBP>160, DBP >110   -F/u PIH labs   2. Polysubstance abuse: Endorses heroin, tobacco (1/2 PPD), and THC use. UDS positive for cocaine, opiates, and THC today. Last snorted heroin last night,  around 930 pm. Desires nicotine patch.   -SW consult (also for late/limited PNC)   -Nicotine patch   -Monitor for withdrawal symptoms-symptomatic treatment as necessary    Allayne Stack  Family Medicine PGY-1  04/24/2018, 4:25 PM   OB FELLOW HISTORY AND PHYSICAL ATTESTATION  I have seen and examined this patient; I agree with above documentation in the resident's note.   Gwenevere Abbot, MD OB Fellow  04/25/2018, 10:30 AM

## 2018-04-25 ENCOUNTER — Encounter (HOSPITAL_COMMUNITY): Payer: Self-pay | Admitting: *Deleted

## 2018-04-25 ENCOUNTER — Inpatient Hospital Stay (HOSPITAL_COMMUNITY): Payer: Medicaid Other | Admitting: Anesthesiology

## 2018-04-25 DIAGNOSIS — F1190 Opioid use, unspecified, uncomplicated: Secondary | ICD-10-CM

## 2018-04-25 DIAGNOSIS — Z3A38 38 weeks gestation of pregnancy: Secondary | ICD-10-CM

## 2018-04-25 DIAGNOSIS — F1410 Cocaine abuse, uncomplicated: Secondary | ICD-10-CM

## 2018-04-25 DIAGNOSIS — O134 Gestational [pregnancy-induced] hypertension without significant proteinuria, complicating childbirth: Secondary | ICD-10-CM

## 2018-04-25 DIAGNOSIS — O99324 Drug use complicating childbirth: Secondary | ICD-10-CM

## 2018-04-25 DIAGNOSIS — O4103X0 Oligohydramnios, third trimester, not applicable or unspecified: Secondary | ICD-10-CM

## 2018-04-25 LAB — CBC
HCT: 34.3 % — ABNORMAL LOW (ref 36.0–46.0)
HEMOGLOBIN: 11.4 g/dL — AB (ref 12.0–15.0)
MCH: 31.6 pg (ref 26.0–34.0)
MCHC: 33.2 g/dL (ref 30.0–36.0)
MCV: 95 fL (ref 80.0–100.0)
Platelets: 229 10*3/uL (ref 150–400)
RBC: 3.61 MIL/uL — AB (ref 3.87–5.11)
RDW: 15.1 % (ref 11.5–15.5)
WBC: 13.8 10*3/uL — ABNORMAL HIGH (ref 4.0–10.5)

## 2018-04-25 LAB — RPR: RPR: NONREACTIVE

## 2018-04-25 MED ORDER — SODIUM CHLORIDE 0.9 % IV SOLN: 250.0000 mL | INTRAVENOUS | Status: DC | PRN

## 2018-04-25 MED ORDER — COCONUT OIL OIL: 1.0000 "application " | TOPICAL_OIL | Status: DC | PRN

## 2018-04-25 MED ORDER — FENTANYL CITRATE (PF) 100 MCG/2ML IJ SOLN
INTRAMUSCULAR | Status: DC | PRN
  Administered 2018-04-25: 100 ug via INTRAVENOUS

## 2018-04-25 MED ORDER — FENTANYL 2.5 MCG/ML BUPIVACAINE 1/10 % EPIDURAL INFUSION (WH - ANES)
14.0000 mL/h | INTRAMUSCULAR | Status: DC | PRN
  Administered 2018-04-25 (×2): 14 mL/h via EPIDURAL
  Filled 2018-04-25 (×2): qty 100

## 2018-04-25 MED ORDER — SODIUM CHLORIDE 0.9% FLUSH: 3.0000 mL | Freq: Two times a day (BID) | INTRAVENOUS | Status: DC

## 2018-04-25 MED ORDER — BUPRENORPHINE HCL 8 MG SL SUBL
4.0000 mg | SUBLINGUAL_TABLET | Freq: Two times a day (BID) | SUBLINGUAL | Status: DC
  Administered 2018-04-26 – 2018-04-27 (×3): 4 mg via SUBLINGUAL
  Filled 2018-04-25 (×3): qty 1

## 2018-04-25 MED ORDER — DIBUCAINE 1 % RE OINT: 1.0000 "application " | TOPICAL_OINTMENT | RECTAL | Status: DC | PRN

## 2018-04-25 MED ORDER — MISOPROSTOL 25 MCG QUARTER TABLET
25.0000 ug | ORAL_TABLET | ORAL | Status: DC | PRN
  Administered 2018-04-25: 25 ug via VAGINAL
  Filled 2018-04-25: qty 1

## 2018-04-25 MED ORDER — BUPIVACAINE HCL (PF) 0.25 % IJ SOLN
INTRAMUSCULAR | Status: DC | PRN
  Administered 2018-04-25: 10 mL via EPIDURAL

## 2018-04-25 MED ORDER — BUPRENORPHINE HCL 8 MG SL SUBL
4.0000 mg | SUBLINGUAL_TABLET | Freq: Every day | SUBLINGUAL | Status: DC
  Administered 2018-04-25: 4 mg via SUBLINGUAL
  Filled 2018-04-25: qty 1

## 2018-04-25 MED ORDER — MEASLES, MUMPS & RUBELLA VAC ~~LOC~~ INJ
0.5000 mL | INJECTION | Freq: Once | SUBCUTANEOUS | Status: DC
  Filled 2018-04-25: qty 0.5

## 2018-04-25 MED ORDER — WITCH HAZEL-GLYCERIN EX PADS: 1.0000 "application " | MEDICATED_PAD | CUTANEOUS | Status: DC | PRN

## 2018-04-25 MED ORDER — SENNOSIDES-DOCUSATE SODIUM 8.6-50 MG PO TABS
2.0000 | ORAL_TABLET | ORAL | Status: DC
  Administered 2018-04-26 (×2): 2 via ORAL
  Filled 2018-04-25 (×2): qty 2

## 2018-04-25 MED ORDER — ONDANSETRON HCL 4 MG PO TABS: 4.0000 mg | ORAL_TABLET | ORAL | Status: DC | PRN

## 2018-04-25 MED ORDER — ACETAMINOPHEN 325 MG PO TABS
650.0000 mg | ORAL_TABLET | ORAL | Status: DC | PRN
  Administered 2018-04-25: 650 mg via ORAL
  Filled 2018-04-25: qty 2

## 2018-04-25 MED ORDER — TERBUTALINE SULFATE 1 MG/ML IJ SOLN: 0.2500 mg | Freq: Once | INTRAMUSCULAR | Status: DC | PRN

## 2018-04-25 MED ORDER — ACETAMINOPHEN 160 MG/5ML PO SOLN
650.0000 mg | ORAL | Status: DC | PRN
  Administered 2018-04-26: 650 mg via ORAL
  Filled 2018-04-25: qty 20.3

## 2018-04-25 MED ORDER — BENZOCAINE-MENTHOL 20-0.5 % EX AERO
1.0000 "application " | INHALATION_SPRAY | CUTANEOUS | Status: DC | PRN
  Administered 2018-04-25: 1 via TOPICAL
  Filled 2018-04-25: qty 56

## 2018-04-25 MED ORDER — LACTATED RINGERS IV SOLN: 500.0000 mL | Freq: Once | INTRAVENOUS | Status: DC

## 2018-04-25 MED ORDER — LIDOCAINE HCL (PF) 1 % IJ SOLN
INTRAMUSCULAR | Status: DC | PRN
  Administered 2018-04-25: 13 mL via EPIDURAL

## 2018-04-25 MED ORDER — LIDOCAINE-EPINEPHRINE (PF) 2 %-1:200000 IJ SOLN
INTRAMUSCULAR | Status: DC | PRN
  Administered 2018-04-25 (×2): 5 mL via EPIDURAL

## 2018-04-25 MED ORDER — TETANUS-DIPHTH-ACELL PERTUSSIS 5-2.5-18.5 LF-MCG/0.5 IM SUSP: 0.5000 mL | Freq: Once | INTRAMUSCULAR | Status: DC

## 2018-04-25 MED ORDER — BUPRENORPHINE HCL 8 MG SL SUBL
4.0000 mg | SUBLINGUAL_TABLET | Freq: Once | SUBLINGUAL | Status: AC
  Administered 2018-04-25: 16:00:00 via SUBLINGUAL
  Filled 2018-04-25: qty 1

## 2018-04-25 MED ORDER — EPHEDRINE 5 MG/ML INJ: 10.0000 mg | INTRAVENOUS | Status: DC | PRN

## 2018-04-25 MED ORDER — DIPHENHYDRAMINE HCL 50 MG/ML IJ SOLN: 12.5000 mg | INTRAMUSCULAR | Status: DC | PRN

## 2018-04-25 MED ORDER — PHENYLEPHRINE 40 MCG/ML (10ML) SYRINGE FOR IV PUSH (FOR BLOOD PRESSURE SUPPORT): 80.0000 ug | PREFILLED_SYRINGE | INTRAVENOUS | Status: DC | PRN

## 2018-04-25 MED ORDER — SODIUM CHLORIDE 0.9% FLUSH: 3.0000 mL | INTRAVENOUS | Status: DC | PRN

## 2018-04-25 MED ORDER — IBUPROFEN 100 MG/5ML PO SUSP
600.0000 mg | Freq: Four times a day (QID) | ORAL | Status: DC
  Administered 2018-04-26 – 2018-04-27 (×7): 600 mg via ORAL
  Filled 2018-04-25 (×11): qty 30

## 2018-04-25 MED ORDER — ZOLPIDEM TARTRATE 5 MG PO TABS: 5.0000 mg | ORAL_TABLET | Freq: Every evening | ORAL | Status: DC | PRN

## 2018-04-25 MED ORDER — IBUPROFEN 600 MG PO TABS
600.0000 mg | ORAL_TABLET | Freq: Four times a day (QID) | ORAL | Status: DC
  Administered 2018-04-25: 600 mg via ORAL
  Filled 2018-04-25: qty 1

## 2018-04-25 MED ORDER — PRENATAL MULTIVITAMIN CH
1.0000 | ORAL_TABLET | Freq: Every day | ORAL | Status: DC
  Administered 2018-04-26 – 2018-04-27 (×2): 1 via ORAL
  Filled 2018-04-25 (×2): qty 1

## 2018-04-25 MED ORDER — ONDANSETRON HCL 4 MG/2ML IJ SOLN: 4.0000 mg | INTRAMUSCULAR | Status: DC | PRN

## 2018-04-25 MED ORDER — SIMETHICONE 80 MG PO CHEW: 80.0000 mg | CHEWABLE_TABLET | ORAL | Status: DC | PRN

## 2018-04-25 MED ORDER — PHENYLEPHRINE 40 MCG/ML (10ML) SYRINGE FOR IV PUSH (FOR BLOOD PRESSURE SUPPORT)
80.0000 ug | PREFILLED_SYRINGE | INTRAVENOUS | Status: DC | PRN
  Filled 2018-04-25: qty 10

## 2018-04-25 MED ORDER — DIPHENHYDRAMINE HCL 25 MG PO CAPS: 25.0000 mg | ORAL_CAPSULE | Freq: Four times a day (QID) | ORAL | Status: DC | PRN

## 2018-04-25 MED ORDER — OXYTOCIN 40 UNITS IN LACTATED RINGERS INFUSION - SIMPLE MED
1.0000 m[IU]/min | INTRAVENOUS | Status: DC
  Administered 2018-04-25: 4 m[IU]/min via INTRAVENOUS
  Administered 2018-04-25: 2 m[IU]/min via INTRAVENOUS

## 2018-04-25 NOTE — Progress Notes (Signed)
LABOR PROGRESS NOTE  Meredith Vega is a 27 y.o. G1P0 at [redacted]w[redacted]d  admitted for IOL 2/2 oligo.  Subjective: Crampy and anxious, wishes to have an epidural when able.  Objective: BP 103/71   Pulse 88   Temp 97.9 F (36.6 C) (Oral)   Resp 20   Ht 5\' 5"  (1.651 m)   Wt 79.5 kg   LMP 10/03/2017   BMI 29.16 kg/m  or  Vitals:   04/24/18 2330 04/25/18 0132 04/25/18 0251 04/25/18 0252  BP:  123/81  103/71  Pulse:  90  88  Resp:  20  20  Temp: 98.2 F (36.8 C)  97.9 F (36.6 C)   TempSrc: Oral  Oral   Weight:      Height:        Dilation: 3.5 Effacement (%): 50 Cervical Position: Middle Station: -2 Presentation: Vertex Exam by:: S Willis RN FHT: baseline rate 150, moderate varibility, +acel, -decel Toco: Irregular contractions  Labs: Lab Results  Component Value Date   WBC 13.5 (H) 04/24/2018   HGB 11.7 (L) 04/24/2018   HCT 34.0 (L) 04/24/2018   MCV 92.9 04/24/2018   PLT 258 04/24/2018    Patient Active Problem List   Diagnosis Date Noted  . Labor and delivery, indication for care 04/24/2018  . Heroin abuse affecting pregnancy (HCC) 04/09/2018  . Cocaine abuse affecting pregnancy (HCC) 04/09/2018  . Opioid abuse (HCC) 04/09/2018  . Supervision of high-risk pregnancy 03/12/2018  . Drug abuse (HCC) 03/12/2018    Assessment / Plan: 27 y.o. G1P0 at [redacted]w[redacted]d here for IOL 2/2 oligo, gHTN  Labor: Cytotec x2, plan to start pitocin at 0530 pending favorable cervix Fetal Wellbeing:  Cat 1 Pain Control:  IV prn, plan for epidural when able, patient may require higher doses given history of opiate use Anticipated MOD:  Vaginal  Opiate use disorder: Last used heroin 2130 on 10/28, currently with only mild symptoms of withdrawal.   - Plan to administer buprenorphine when withdrawal more pronounced (COW ~10-12) - patient interested in therapy and continuing it outpatient  gHTN: Mild range pressures, UPC 0.12 and HELLP WNL. - CTM  Terisa Starr, MD 04/25/2018, 4:21 AM

## 2018-04-25 NOTE — Progress Notes (Signed)
Dr Hyacinth Meeker (anesthesia) notified for clarification of order to remove epidural catheter in hypertensive patient without repeat CBC.  Order clarified that it is ok to pull catheter since last platelet count was >150,000

## 2018-04-25 NOTE — Anesthesia Preprocedure Evaluation (Signed)
Anesthesia Evaluation  Patient identified by MRN, date of birth, ID band Patient awake    Reviewed: Allergy & Precautions, NPO status , Patient's Chart, lab work & pertinent test results  Airway Mallampati: II  TM Distance: >3 FB Neck ROM: Full    Dental no notable dental hx.    Pulmonary asthma , Current Smoker,    Pulmonary exam normal breath sounds clear to auscultation       Cardiovascular negative cardio ROS Normal cardiovascular exam Rhythm:Regular Rate:Normal     Neuro/Psych negative neurological ROS  negative psych ROS   GI/Hepatic negative GI ROS, (+)     substance abuse  cocaine use and marijuana use,   Endo/Other  negative endocrine ROS  Renal/GU negative Renal ROS  negative genitourinary   Musculoskeletal negative musculoskeletal ROS (+) narcotic dependent  Abdominal   Peds negative pediatric ROS (+)  Hematology negative hematology ROS (+)   Anesthesia Other Findings   Reproductive/Obstetrics (+) Pregnancy                             Anesthesia Physical Anesthesia Plan  ASA: III  Anesthesia Plan: Epidural   Post-op Pain Management:    Induction:   PONV Risk Score and Plan:   Airway Management Planned:   Additional Equipment:   Intra-op Plan:   Post-operative Plan:   Informed Consent:   Plan Discussed with:   Anesthesia Plan Comments:         Anesthesia Quick Evaluation

## 2018-04-25 NOTE — Anesthesia Procedure Notes (Signed)
Epidural Patient location during procedure: OB Start time: 04/25/2018 7:34 AM End time: 04/25/2018 7:49 AM  Staffing Anesthesiologist: Lowella Curb, MD Performed: anesthesiologist   Preanesthetic Checklist Completed: patient identified, site marked, surgical consent, pre-op evaluation, timeout performed, IV checked, risks and benefits discussed and monitors and equipment checked  Epidural Patient position: sitting Prep: ChloraPrep Patient monitoring: heart rate, cardiac monitor, continuous pulse ox and blood pressure Approach: midline Location: L2-L3 Injection technique: LOR saline  Needle:  Needle type: Tuohy  Needle gauge: 17 G Needle length: 9 cm Needle insertion depth: 6 cm Catheter type: closed end flexible Catheter size: 20 Guage Catheter at skin depth: 10 cm Test dose: negative  Assessment Events: blood not aspirated, injection not painful, no injection resistance, negative IV test and no paresthesia  Additional Notes Reason for block:procedure for pain

## 2018-04-25 NOTE — Progress Notes (Signed)
LABOR PROGRESS NOTE  Meredith Vega is a 27 y.o. G1P0 at [redacted]w[redacted]d  admitted for IOL 2/2 gHTN and oligo.  Subjective: Epidural in place. Reports she has been sleeping for past couple of hours. But now feeling anxious, sweaty.   Objective: BP 136/90   Pulse 80   Temp (!) 97.4 F (36.3 C) (Oral)   Resp 18   Ht 5\' 5"  (1.651 m)   Wt 79.5 kg   LMP 10/03/2017   SpO2 98%   BMI 29.16 kg/m  or  Vitals:   04/25/18 1030 04/25/18 1050 04/25/18 1100 04/25/18 1130  BP: 118/82 (!) 135/94 136/86 136/90  Pulse: 85 85 80 80  Resp:  20  18  Temp:      TempSrc:      SpO2:      Weight:      Height:        Dilation: 4.5 Effacement (%): 80 Cervical Position: Middle Station: -2 Presentation: Vertex Exam by:: sowder FHT: baseline rate 130, moderate varibility, +acel, -decel Toco: q2-4 min   Labs: Lab Results  Component Value Date   WBC 13.8 (H) 04/25/2018   HGB 11.4 (L) 04/25/2018   HCT 34.3 (L) 04/25/2018   MCV 95.0 04/25/2018   PLT 229 04/25/2018    Patient Active Problem List   Diagnosis Date Noted  . Labor and delivery, indication for care 04/24/2018  . Heroin abuse affecting pregnancy (HCC) 04/09/2018  . Cocaine abuse affecting pregnancy (HCC) 04/09/2018  . Opioid abuse (HCC) 04/09/2018  . Supervision of high-risk pregnancy 03/12/2018  . Drug abuse (HCC) 03/12/2018    Assessment / Plan: 27 y.o. G1P0 at [redacted]w[redacted]d here for IOL 2/2 oligo, gHTN  Labor: Cytotec x3. Cervix less dilated than previously documented. Will start Pitocin 2x2.  Fetal Wellbeing:  Cat 1 Pain Control:  Epidural in place.  Anticipated MOD:  Vaginal  Opiate use disorder: Last used heroin 2130 on 10/28, currently experiencing more pronounced withdrawal symptoms. Will start Subutex 4 mg and increase daily dose as needed. Continue to monitor COW scoring.   gHTN: Mild range pressures, UPC 0.12 and HELLP WNL. Continue to monitor.   Marcy Siren, D.O. [facility] Family Medicine Fellow, [facility] for  [facility], [facility] 04/25/2018, 12:00 PM

## 2018-04-25 NOTE — Anesthesia Pain Management Evaluation Note (Signed)
  CRNA Pain Management Visit Note  Patient: Meredith Vega, 27 y.o., female  "Hello I am a member of the anesthesia team at [facility]. We have an anesthesia team available at all times to provide care throughout the hospital, including epidural management and anesthesia for C-section. I don't know your plan for the delivery whether it a natural birth, water birth, IV sedation, nitrous supplementation, doula or epidural, but we want to meet your pain goals."   1.Was your pain managed to your expectations on prior hospitalizations?   No prior hospitalizations  2.What is your expectation for pain management during this hospitalization?     Epidural  3.How can we help you reach that goal?   Record the patient's initial score and the patient's pain goal.   Pain: 0  Pain Goal: 4 [facility] wants you to be able to say your pain was always managed very well.  Meredith Vega 04/25/2018

## 2018-04-25 NOTE — Progress Notes (Signed)
CSW acknowledged consult and will meet with patient after L&D.  Izreal Kock Boyd-Gilyard, MSW, LCSW Clinical Social Work [phone]  

## 2018-04-26 LAB — GROUP B STREP BY PCR: GROUP B STREP BY PCR: NEGATIVE

## 2018-04-26 NOTE — Anesthesia Postprocedure Evaluation (Signed)
Anesthesia Post Note  Patient: Meredith Vega  Procedure(s) Performed: AN AD HOC LABOR EPIDURAL     Patient location during evaluation: Mother Baby Anesthesia Type: Epidural Level of consciousness: awake and alert and oriented Pain management: satisfactory to patient Vital Signs Assessment: post-procedure vital signs reviewed and stable Respiratory status: respiratory function stable Cardiovascular status: stable Postop Assessment: no headache, no backache, epidural receding, patient able to bend at knees, no signs of nausea or vomiting and adequate PO intake Anesthetic complications: no    Last Vitals:  Vitals:   04/26/18 0006 04/26/18 0405  BP: 121/68 126/85  Pulse: 86 76  Resp: 18 18  Temp: 37.1 C 36.6 C  SpO2: 100% 99%    Last Pain:  Vitals:   04/26/18 0545  TempSrc:   PainSc: 4    Pain Goal:                 [name]

## 2018-04-26 NOTE — Clinical Social Work Maternal (Signed)
CLINICAL SOCIAL WORK MATERNAL/CHILD NOTE  Patient Details  Name: Meredith Vega MRN: [id number] Date of Birth: [birth year]  Date:  04/26/2018  Clinical Social Worker Initiating Note:  Laurey Arrow Date/Time: Initiated:  04/26/18/1308     Child's Name:  Meredith Vega   Biological Parents:  Mother, Father   Need for Interpreter:  None   Reason for Referral:  Late or No Prenatal Care , Current Substance Use/Substance Use During Pregnancy (Late/Limited PNC and hx of polysubstance use. )   Address:  [place] [address]    Phone number:  [phone] (home)     Additional phone number:   Household Members/Support Persons (HM/SP):   Household Member/Support Person 1   HM/SP Name Relationship DOB or Age  HM/SP -1 Piedad Climes FOB 12/03/1978  HM/SP -2        HM/SP -3        HM/SP -4        HM/SP -5        HM/SP -6        HM/SP -7        HM/SP -8          Natural Supports (not living in the home):  Friends(Per [name] the family has limited family support.  FOB family will be a support however, FOB's family resides in [place]. )   Professional Supports: None   Employment: Unemployed   Type of Work:     Education:  9 to 11 years   Homebound arranged: No  Financial Resources:  [employer]   Other Resources:  (CSW informed [name] of egibility for [employer] and [employer].  [name] plans to apply. )   Cultural/Religious Considerations Which May Impact Care:  none reported  Strengths:  Ability to meet basic needs    Psychotropic Medications:         Pediatrician:       Pediatrician List:   [facility]      Pediatrician Fax Number:    Risk Factors/Current Problems:  Substance Use , DHHS Involvement    Cognitive State:  Able to Concentrate , Alert , Linear Thinking    Mood/Affect:  Calm , Relaxed , Interested , Happy    CSW Assessment: CSW met with [name] at  bedside to discuss consult for drug exposed newborn and late prenatal care. CSW informed [name] about reason for consult and [name] disclosed that her last use for heroin was 3-4 days ago. [name] reported that she used marijuana approx. 1 week ago cocaine in August 2019. [name] reported that she does not plan to continue using heroin and that she has been started on Subutex while in the hospital and plans to continue after discharge, [name] was not able to verbalize where she will follow up to continue treatment with Subutex. CSW offered patient substance abuse treatment options, patient declined substance abuse treatment resources. CSW inquired if [name] had any mental health history, [name] denied any mental health history. CSW inquired about [name] late prenatal care, [name] reported that she had 4 visits during her pregnancy. CSW was only able to verify two prenatal visits per chart review.  CSW inquired [name] living arrangement, [name] reported that she and FOB currently rent a room and that they may be able to get an apartment when FOB gets paid on 04/27/2018. [name] reported that they have all  essential for infant.  [name] was engaged throughout assessment and appeared to be bonded with baby as evidenced by feeding baby. CSW assessed for safety, [name] denied SI, HI and domestic violence.  [name] informed that due to drug abuse history that baby would be screened for drugs twice (UDS and CDS) and that since baby had a positive UDS for opiates a CPS report would be made, [name] inquired about what CPS would do. CSW explained the CPS process, [name] verbalized understanding.  CSW provided education regarding the baby blues period vs. perinatal mood disorders, discussed treatment and gave resources for mental health follow up if concerns arise.  CSW recommends self-evaluation during the postpartum time period using the New Mom Checklist from Postpartum Progress and encouraged [name] to contact a medical professional if symptoms are noted at any time.   CSW  provided review of Sudden Infant Death Syndrome (SIDS) precautions.   CSW made a report to [place] ([place] for infant's positive CDS for opiates. CPS will follow-up with family within 24 hours.  At this time there barriers to infant discharging to [name] and FOB  CSW Plan/Description:  Sudden Infant Death Syndrome (SIDS) Education, Perinatal Mood and Anxiety Disorder (PMADs) Education, Neonatal Abstinence Syndrome (NAS) Education, Other Patient/Family Education, [place], Child Protective Service Report , CSW Awaiting CPS Disposition Plan   Laurey Arrow, MSW, LCSW Clinical Social Work [phone]   Dimple Nanas, LCSW 04/26/2018, 4:08 PM

## 2018-04-26 NOTE — Progress Notes (Signed)
POSTPARTUM PROGRESS NOTE  Post Partum Day 1  Subjective:  Meredith Vega is a 27 y.o. G1P1001 s/p SVD at [redacted]w[redacted]d.  She reports she is doing well. No acute events overnight. She denies any problems with ambulating, voiding or po intake. No BMs. Passing gas. Denies nausea or vomiting.  Pain is well controlled.  Lochia is improved and stable. Undecided about birth control method, but interested in discussing options.  Objective: Blood pressure 126/85, pulse 76, temperature 97.9 F (36.6 C), temperature source Oral, resp. rate 18, height 5\' 5"  (1.651 m), weight 79.5 kg, last menstrual period 10/03/2017, SpO2 99 %, unknown if currently breastfeeding.  Physical Exam:  General: alert, cooperative and no distress Chest: no respiratory distress Heart:regular rate, distal pulses intact Abdomen: soft, mildly TTP diffusely  Uterine Fundus: firm, appropriately tender DVT Evaluation: No calf swelling or tenderness Extremities: No LE edema Skin: warm, dry  Recent Labs    04/24/18 1701 04/25/18 0710  HGB 11.7* 11.4*  HCT 34.0* 34.3*    Assessment/Plan: Meredith Vega is a 27 y.o. G1P1001 s/p SVD at [redacted]w[redacted]d   PPD#1 - Doing well  -- Routine postpartum care  -- Start subutex 4 mg twice daily this AM  -- Cont nicotine patches  -- SW consult  Contraception: Undecided. Briefly discussed options with patient this morning. Feeding: Formula Dispo: Plan for discharge tomorrow   LOS: 2 days   Raul Del, MS3 04/26/2018, 7:48 AM   CNM attestation Post Partum Day #1 I have seen and examined this patient and agree with above documentation in the med student's note.   Meredith Vega is a 27 y.o. G1P1001 s/p SVD.  Pt denies problems with ambulating, voiding or po intake. Pain is well controlled.  Plan for birth control is unsure.  Method of Feeding: bottle  PE:  BP 137/78 (BP Location: Left Arm)   Pulse 81   Temp 98 F (36.7 C) (Oral)   Resp 16   Ht 5\' 5"  (1.651 m)   Wt 79.5 kg    LMP 10/03/2017   SpO2 99%   Breastfeeding? Unknown   BMI 29.16 kg/m  Fundus firm  Plan for discharge: 04/27/18- baby will most likely stay for NAS SW consult marked as completed- awaiting note  Cam Hai, CNM 9:40 AM  04/26/2018

## 2018-04-27 DIAGNOSIS — Z79899 Other long term (current) drug therapy: Secondary | ICD-10-CM

## 2018-04-27 DIAGNOSIS — Z5181 Encounter for therapeutic drug level monitoring: Secondary | ICD-10-CM

## 2018-04-27 MED ORDER — IBUPROFEN 100 MG/5ML PO SUSP: 600.0000 mg | Freq: Four times a day (QID) | ORAL | 3 refills | Status: DC | PRN

## 2018-04-27 MED ORDER — BUPRENORPHINE HCL-NALOXONE HCL 4-1 MG SL FILM: 4.0000 mg | ORAL_FILM | Freq: Two times a day (BID) | SUBLINGUAL | 0 refills | Status: DC

## 2018-04-27 NOTE — Discharge Instructions (Signed)
Vaginal Delivery, Care After °Refer to this sheet in the next few weeks. These instructions provide you with information about caring for yourself after vaginal delivery. Your health care provider may also give you more specific instructions. Your treatment has been planned according to current medical practices, but problems sometimes occur. Call your health care provider if you have any problems or questions. °What can I expect after the procedure? °After vaginal delivery, it is common to have: °· Some bleeding from your vagina. °· Soreness in your abdomen, your vagina, and the area of skin between your vaginal opening and your anus (perineum). °· Pelvic cramps. °· Fatigue. ° °Follow these instructions at home: °Medicines °· Take over-the-counter and prescription medicines only as told by your health care provider. °· If you were prescribed an antibiotic medicine, take it as told by your health care provider. Do not stop taking the antibiotic until it is finished. °Driving ° °· Do not drive or operate heavy machinery while taking prescription pain medicine. °· Do not drive for 24 hours if you received a sedative. °Lifestyle °· Do not drink alcohol. This is especially important if you are breastfeeding or taking medicine to relieve pain. °· Do not use tobacco products, including cigarettes, chewing tobacco, or e-cigarettes. If you need help quitting, ask your health care provider. °Eating and drinking °· Drink at least 8 eight-ounce glasses of water every day unless you are told not to by your health care provider. If you choose to breastfeed your baby, you may need to drink more water than this. °· Eat high-fiber foods every day. These foods may help prevent or relieve constipation. High-fiber foods include: °? Whole grain cereals and breads. °? Brown rice. °? Beans. °? Fresh fruits and vegetables. °Activity °· Return to your normal activities as told by your health care provider. Ask your health care provider  what activities are safe for you. °· Rest as much as possible. Try to rest or take a nap when your baby is sleeping. °· Do not lift anything that is heavier than your baby or 10 lb (4.5 kg) until your health care provider says that it is safe. °· Talk with your health care provider about when you can engage in sexual activity. This may depend on your: °? Risk of infection. °? Rate of healing. °? Comfort and desire to engage in sexual activity. °Vaginal Care °· If you have an episiotomy or a vaginal tear, check the area every day for signs of infection. Check for: °? More redness, swelling, or pain. °? More fluid or blood. °? Warmth. °? Pus or a bad smell. °· Do not use tampons or douches until your health care provider says this is safe. °· Watch for any blood clots that may pass from your vagina. These may look like clumps of dark red, brown, or black discharge. °General instructions °· Keep your perineum clean and dry as told by your health care provider. °· Wear loose, comfortable clothing. °· Wipe from front to back when you use the toilet. °· Ask your health care provider if you can shower or take a bath. If you had an episiotomy or a perineal tear during labor and delivery, your health care provider may tell you not to take baths for a certain length of time. °· Wear a bra that supports your breasts and fits you well. °· If possible, have someone help you with household activities and help care for your baby for at least a few days after   you leave the hospital. °· Keep all follow-up visits for you and your baby as told by your health care provider. This is important. °Contact a health care provider if: °· You have: °? Vaginal discharge that has a bad smell. °? Difficulty urinating. °? Pain when urinating. °? A sudden increase or decrease in the frequency of your bowel movements. °? More redness, swelling, or pain around your episiotomy or vaginal tear. °? More fluid or blood coming from your episiotomy or  vaginal tear. °? Pus or a bad smell coming from your episiotomy or vaginal tear. °? A fever. °? A rash. °? Little or no interest in activities you used to enjoy. °? Questions about caring for yourself or your baby. °· Your episiotomy or vaginal tear feels warm to the touch. °· Your episiotomy or vaginal tear is separating or does not appear to be healing. °· Your breasts are painful, hard, or turn red. °· You feel unusually sad or worried. °· You feel nauseous or you vomit. °· You pass large blood clots from your vagina. If you pass a blood clot from your vagina, save it to show to your health care provider. Do not flush blood clots down the toilet without having your health care provider look at them. °· You urinate more than usual. °· You are dizzy or light-headed. °· You have not breastfed at all and you have not had a menstrual period for 12 weeks after delivery. °· You have stopped breastfeeding and you have not had a menstrual period for 12 weeks after you stopped breastfeeding. °Get help right away if: °· You have: °? Pain that does not go away or does not get better with medicine. °? Chest pain. °? Difficulty breathing. °? Blurred vision or spots in your vision. °? Thoughts about hurting yourself or your baby. °· You develop pain in your abdomen or in one of your legs. °· You develop a severe headache. °· You faint. °· You bleed from your vagina so much that you fill two sanitary pads in one hour. °This information is not intended to replace advice given to you by your health care provider. Make sure you discuss any questions you have with your health care provider. °Document Released: 06/10/2000 Document Revised: 11/25/2015 Document Reviewed: 06/28/2015 °Elsevier Interactive Patient Education © 2018 [employer] ° °

## 2018-04-27 NOTE — Discharge Summary (Signed)
OB Discharge Summary     Patient Name: Meredith Vega DOB: 1991/03/01 MRN: [id number]  Date of admission: 04/24/2018 Delivering MD: Allayne Stack   Date of discharge: 04/27/2018  Admitting diagnosis: ELEVATED BP Intrauterine pregnancy: [redacted]w[redacted]d     Secondary diagnosis:  Principal Problem:   s/p spontaneous vaginal delivery Active Problems:   Drug abuse (HCC)   Heroin abuse affecting pregnancy (HCC)   Cocaine abuse affecting pregnancy (HCC)   Opioid abuse (HCC)   Subutex maintenance therapy  Additional problems: None     Discharge diagnosis: Term Pregnancy Delivered                                                                                                Post partum procedures:Inititation of Subutex therapy  Augmentation: Pitocin and Cytotec   Complications: None  Hospital course:  Onset of Labor With Vaginal Delivery     27 y.o. yo G1P1001 at [redacted]w[redacted]d was admitted in Latent Labor on 04/24/2018. Patient had an uncomplicated labor course as follows:  Membrane Rupture Time/Date: 1:24 PM ,04/25/2018   Intrapartum Procedures: Episiotomy: None [1]                                         Lacerations:  1st degree [2];Labial [10]  Patient had a delivery of a Viable infant. 04/25/2018  Information for the patient's newborn:  Emmerie, Battaglia [[id number]]  Delivery Method: Vaginal, Spontaneous(Filed from Delivery Summary)   Pateint had an uncomplicated postpartum course.  Given her history of active substance abuse, Subutex was started while she was inpatient here. She will be discharged with Suboxone prescription and will follow up on 04/30/18 with Dr. Adrian Blackwater to discuss long term management.  Seen by SW, CPS is involved. She is ambulating, tolerating a regular diet, passing flatus, and urinating well. Patient is discharged home in stable condition on 04/27/18.   Physical exam  Vitals:   04/26/18 0740 04/26/18 1523 04/26/18 2205 04/27/18 0521  BP: 137/78 131/88 128/71 (!)  125/91  Pulse: 81 99 (!) 103 83  Resp: 16 18 18 18   Temp: 98 F (36.7 C) 97.9 F (36.6 C) 97.9 F (36.6 C) 98.2 F (36.8 C)  TempSrc: Oral Oral Oral Oral  SpO2:   100% 100%  Weight:      Height:       General: alert, cooperative and no distress Lochia: appropriate Uterine Fundus: firm DVT Evaluation: No evidence of DVT seen on physical exam. Negative Homan's sign. No cords or calf tenderness. Labs: Lab Results  Component Value Date   WBC 13.8 (H) 04/25/2018   HGB 11.4 (L) 04/25/2018   HCT 34.3 (L) 04/25/2018   MCV 95.0 04/25/2018   PLT 229 04/25/2018   CMP Latest Ref Rng & Units 04/24/2018  Glucose 70 - 99 mg/dL 93  BUN 6 - 20 mg/dL 11  Creatinine 7.82 - 9.56 mg/dL 2.13  Sodium 086 - 578 mmol/L 136  Potassium 3.5 - 5.1 mmol/L 4.0  Chloride 98 - 111  mmol/L 104  CO2 22 - 32 mmol/L 22  Calcium 8.9 - 10.3 mg/dL 9.5(A)  Total Protein 6.5 - 8.1 g/dL 6.7  Total Bilirubin 0.3 - 1.2 mg/dL 0.3  Alkaline Phos 38 - 126 U/L 131(H)  AST 15 - 41 U/L 16  ALT 0 - 44 U/L 15    Discharge instruction: per After Visit Summary and "Baby and Me Booklet".  After visit meds:  Allergies as of 04/27/2018      Reactions   Ceclor [cefaclor] Hives      Medication List    STOP taking these medications   nitrofurantoin (macrocrystal-monohydrate) 100 MG capsule Commonly known as:  MACROBID     TAKE these medications   albuterol 108 (90 Base) MCG/ACT inhaler Commonly known as:  PROVENTIL HFA;VENTOLIN HFA Inhale 1-2 puffs into the lungs every 6 (six) hours as needed for wheezing or shortness of breath.   Buprenorphine HCl-Naloxone HCl 4-1 MG Film Place 4 mg under the tongue 2 (two) times daily for 4 days.   cetirizine 10 MG tablet Commonly known as:  ZYRTEC Take 1 tablet (10 mg total) by mouth daily.   ibuprofen 100 MG/5ML suspension Commonly known as:  ADVIL,MOTRIN Take 30 mLs (600 mg total) by mouth every 6 (six) hours as needed for moderate pain.   predniSONE 20 MG  tablet Commonly known as:  DELTASONE 2 tabs po daily x 3 days   prenatal multivitamin Tabs tablet Take 1 tablet by mouth daily at 12 noon.       Diet: routine diet  Activity: Advance as tolerated. Pelvic rest for 6 weeks.   Future Appointments  Date Time Provider Department Center  04/30/2018  8:35 AM Levie Heritage, DO [facility]  05/11/2018 11:00 AM Constant, Gigi Gin, MD [facility] None  06/04/2018 10:45 AM Constant, Gigi Gin, MD [facility] None   Postpartum contraception: Undecided  Newborn Data: Live born female  Birth Weight: 6 lb 7.7 oz (2940 g) APGAR: 8, 9  Newborn Delivery   Birth date/time:  04/25/2018 17:07:00 Delivery type:  Vaginal, Spontaneous     Baby Feeding: Bottle Disposition:Unsure at this point pending CPS decision   04/27/2018 Jaynie Collins, MD

## 2018-04-27 NOTE — Progress Notes (Signed)
CPS case has been assigned to CPS worker A. Janee Morn [phone]).  CPS worker will make contact with family within 72 hours an devise a safety discharge plan for infant. CSW has requested that CPS speak with CSW after plan has is finalized.   At this time there are barriers to infant discharging to [name] and [facility].  Blaine Hamper, MSW, LCSW Clinical Social Work [phone]

## 2018-04-30 ENCOUNTER — Encounter: Payer: Self-pay | Admitting: Family Medicine

## 2018-04-30 ENCOUNTER — Telehealth: Payer: Self-pay

## 2018-04-30 ENCOUNTER — Ambulatory Visit (INDEPENDENT_AMBULATORY_CARE_PROVIDER_SITE_OTHER): Payer: Medicaid Other | Admitting: Family Medicine

## 2018-04-30 DIAGNOSIS — Z5181 Encounter for therapeutic drug level monitoring: Secondary | ICD-10-CM

## 2018-04-30 DIAGNOSIS — F1190 Opioid use, unspecified, uncomplicated: Secondary | ICD-10-CM

## 2018-04-30 DIAGNOSIS — Z79899 Other long term (current) drug therapy: Secondary | ICD-10-CM

## 2018-04-30 DIAGNOSIS — F1199 Opioid use, unspecified with unspecified opioid-induced disorder: Secondary | ICD-10-CM

## 2018-04-30 MED ORDER — BUPRENORPHINE HCL-NALOXONE HCL 4-1 MG SL FILM: 12.0000 mg | ORAL_FILM | Freq: Every day | SUBLINGUAL | 0 refills | Status: AC

## 2018-04-30 NOTE — Telephone Encounter (Signed)
[facility] with [facility] requesting [facility] appt for pt. Please call back.

## 2018-04-30 NOTE — Progress Notes (Signed)
Wants Birth Control Pills

## 2018-04-30 NOTE — Addendum Note (Signed)
Addended by: Henrietta Dine on: 04/30/2018 09:45 AM   Modules accepted: Orders

## 2018-04-30 NOTE — Progress Notes (Signed)
   04/30/2018  Meredith Vega presents for buprenorphine/naloxone intake visit.   I have reviewed EPIC data including labwork which was available.  I have reviewed outside records provided by patient if available.  The salient points were confirmed with the patient.    Review of substance use history: Pills - 7 years ago, heroin 2.5 years ago. Cocaine: intermittently since 27yo. Was on Suboxone in [place], about 3 years ago. Relapsed when she moved back up here 2.5 years ago. Was on suboxone 8mg  daily there.   Came in labor having taken heroin and cocaine. Was started on suboxone there - 4mg  twice a day. Feeling some withdrawal symptoms just prior to taking next dose (achy, stomach cramps), worse at night.   Last substance used: Cocaine, heroine. Heroin: 1/2 - 1g per day.   If last substance not an opioid, last opioid used (type, dose, route, withdrawal symptoms): oral, nasal  Mental Health History: None  Current counseling/behavioural health provider: Tried to go to [facility] in august.  This patient has Opioid Use Disorder by following DSM-V criteria:  - Opioids taken in larger amounts or over a longer period than intended - Persistent desire to cut down - A great deal of time is spent to obtain/use/recover from the opioid - Cravings to use opioids - Use resulting in a failure to fulfill major role obligations - Continue opioid use despite persistent social or interpersonal problems - Important activities are given up or reduced because of opioid use - Recurrent opioid use in situations in which it is physically hazardous - Use despite knowledge of health problems caused by opioids - Tolerance - Withdrawal    Past Medical History:  Diagnosis Date  . Asthma    regular inhaler use    Current Outpatient Medications on File Prior to Visit  Medication Sig Dispense Refill  . albuterol (PROVENTIL HFA;VENTOLIN HFA) 108 (90 Base) MCG/ACT inhaler Inhale 1-2 puffs into the lungs every 6  (six) hours as needed for wheezing or shortness of breath. 1 Inhaler 0  . Buprenorphine HCl-Naloxone HCl (SUBOXONE) 4-1 MG FILM Place 4 mg under the tongue 2 (two) times daily for 4 days. 8 each 0  . ibuprofen (ADVIL,MOTRIN) 100 MG/5ML suspension Take 30 mLs (600 mg total) by mouth every 6 (six) hours as needed for moderate pain. 600 mL 3  . Prenatal Vit-Fe Fumarate-FA (PRENATAL MULTIVITAMIN) TABS tablet Take 1 tablet by mouth daily at 12 noon.    . cetirizine (ZYRTEC ALLERGY) 10 MG tablet Take 1 tablet (10 mg total) by mouth daily. (Patient not taking: Reported on 04/17/2018) 30 tablet 0   No current facility-administered medications on file prior to visit.     Physical Exam  Vitals:   04/30/18 0913  BP: 101/64  Pulse: 100  Weight: 158 lb 9.6 oz (71.9 kg)  Height: 5\' 4"  (1.626 m)  A&Ox3, NAD RR, No murmur CTA.  Assessment/Plan:   Based on a review of the patient's medical history including substance use and mental health factors, and physical exam, Meredith Vega is a suitable candidate for MAT with buprenorphine/naloxone.  [facility] ordered this visit.    I have discussed HIV and Hepatitis C screening with this patient. Done during pregnancy  Levie Heritage, DO 04/30/2018 9:28 AM

## 2018-05-01 ENCOUNTER — Telehealth: Payer: Self-pay

## 2018-05-01 NOTE — Telephone Encounter (Signed)
Called [facility] to make appt . For Suboxone maintenance Spoke with Nurse Myriam Jacobson who states the only Dr. Available at this time is Dr.Hoffman &  Once she talks with him will call me back to verify info on pt. And then call Pt. To discuss appt.

## 2018-05-01 NOTE — Telephone Encounter (Signed)
Have spoken to pam at [facility]. Pt has been on suboxone therapy 1 week at this point, will speak w/ dr Mikey Bussing soon

## 2018-05-01 NOTE — Telephone Encounter (Signed)
Looks fine to add to 11/12 schedule.

## 2018-05-01 NOTE — Telephone Encounter (Signed)
Spoke w/pt, confirmed appt.

## 2018-05-01 NOTE — Telephone Encounter (Signed)
Meredith Vega with [facility] would like to speak with Meredith Vega.

## 2018-05-03 ENCOUNTER — Telehealth: Payer: Self-pay

## 2018-05-03 NOTE — Telephone Encounter (Signed)
[facility]ed [facility] to get pts appt for Suboxone Maint., spoke with Helen,RN & she stated appt was scheduled for 05/08/18 @ 10:30am.

## 2018-05-08 ENCOUNTER — Telehealth: Payer: Self-pay | Admitting: *Deleted

## 2018-05-08 MED ORDER — BUPRENORPHINE HCL-NALOXONE HCL 4-1 MG SL FILM: 1.0000 | ORAL_FILM | Freq: Two times a day (BID) | SUBLINGUAL | 0 refills | Status: DC

## 2018-05-08 NOTE — Addendum Note (Signed)
Addended by: Levie HeritageSTINSON,  J on: 05/08/2018 04:38 PM   Modules accepted: Orders

## 2018-05-08 NOTE — Telephone Encounter (Signed)
When is she being established in your office? We will refill the suboxone until she establishes in your office.

## 2018-05-08 NOTE — Telephone Encounter (Signed)
I have spoken again to pt, she states dr Adrian Blackwaterstinson is away today and she has left a message. We have rescheduled her appt for next week and she agrees to be here also she verb she understands that [facility] md cannot provide script because there is no relationship at present, will forward this note to dr Adrian Blackwaterstinson. Pt states she thinks she has enough suboxone to last til tomorrow

## 2018-05-08 NOTE — Telephone Encounter (Signed)
Dr Adrian Blackwaterstinson, her appt in [facility] is 11/19 Thank you

## 2018-05-08 NOTE — Telephone Encounter (Signed)
Thanks Dr. Adrian BlackwaterStinson.  She reported to us that she could not come to appointment today.  We will accept her into our clinic when she is able to make her first intake appointment.  Thanks!

## 2018-05-08 NOTE — Telephone Encounter (Signed)
Pt calls, states she will not be able to come to clinic today due to several factors 1) baby being disch today from NICU at [facility], really does not know what time 2) must pick sig other up from work, does not know what time he will be through She is advised if she is definitely not coming to appt, going to [facility] clinic and ask if last prescribing md would prescribe a weeks work of suboxone or ask NICU to disch baby after appt in [facility] and let sig other wait a little while til after appt She is ask to decide which route she would like and appt will be re-sch for next tues. Spoke to dr Criselda Peaches, she will not prescribe today unless pt comes to appt Will call pt back at [phone] in appr 20 min

## 2018-05-11 ENCOUNTER — Ambulatory Visit: Payer: Self-pay | Admitting: Obstetrics and Gynecology

## 2018-05-15 ENCOUNTER — Telehealth: Payer: Self-pay

## 2018-05-15 ENCOUNTER — Telehealth: Payer: Self-pay | Admitting: *Deleted

## 2018-05-15 NOTE — Telephone Encounter (Signed)
Attempted to notify pt and reschedule appt for oud, no answer, lm for rtc, tried both #'s

## 2018-05-15 NOTE — Telephone Encounter (Signed)
Requesting to speak with Meredith Vega.  

## 2018-05-16 NOTE — Telephone Encounter (Signed)
rtc to pt, rescheduled for next oud, gave directions for how to get to clinic

## 2018-05-22 ENCOUNTER — Ambulatory Visit (INDEPENDENT_AMBULATORY_CARE_PROVIDER_SITE_OTHER): Payer: Medicaid Other | Admitting: Internal Medicine

## 2018-05-22 ENCOUNTER — Encounter: Payer: Self-pay | Admitting: Internal Medicine

## 2018-05-22 ENCOUNTER — Ambulatory Visit: Payer: Medicaid Other | Admitting: Licensed Clinical Social Worker

## 2018-05-22 ENCOUNTER — Other Ambulatory Visit: Payer: Self-pay

## 2018-05-22 VITALS — BP 111/57 | HR 91 | Temp 98.1°F | Wt 155.1 lb

## 2018-05-22 DIAGNOSIS — F1120 Opioid dependence, uncomplicated: Secondary | ICD-10-CM | POA: Diagnosis not present

## 2018-05-22 DIAGNOSIS — F1121 Opioid dependence, in remission: Secondary | ICD-10-CM

## 2018-05-22 DIAGNOSIS — F1110 Opioid abuse, uncomplicated: Secondary | ICD-10-CM

## 2018-05-22 MED ORDER — BUPRENORPHINE HCL-NALOXONE HCL 4-1 MG SL FILM: 1.0000 | ORAL_FILM | Freq: Three times a day (TID) | SUBLINGUAL | 0 refills | Status: DC

## 2018-05-22 NOTE — Patient Instructions (Signed)
Ms. Raynald KempStike - -  It was a pleasure to see you today.   Please start taking your suboxone 3 times a day as you were previously.    Please come back to see us in 1 week.  Please call the clinic if you have any issues over the next week.  We will be closed Thursday and Friday for the holiday.    Thank you!

## 2018-05-22 NOTE — Progress Notes (Signed)
05/22/2018  Meredith Vega presents for buprenorphine/naloxone intake visit.   I have reviewed EPIC data including labwork which was available.  I have reviewed outside records provided by patient if available.  The salient points were confirmed with the patient.    Review of substance use history (first use, substances used, any illicit purchases): She first used heroin about 2.5 to 3 years ago.  For some time prior to that she was using cocaine and "wanted something different."  She has only snorted heroin and never injected.  She has not had any OD or clinical consequences of her use.  She desired to stop her use when she became pregnant.  She got conflicting information about whether she should stop heroin and the safety of the baby, so she did not cease use until after the baby was born.  Since her UDS was positive at the birth, CPS is involved in the care of her child and she has a caretaker for monitored care of the baby.  She has had a very low supply of buprenorphine as she had trouble getting in to see [place].  She has been slowly splitting her buprenorphine in halves and then fourths.  She feels like she is now having a headache due to tension and feeling like she is about to withdraw.  Given that she has been taking buprenorphine for the last 3 weeks, she does not have significant withdrawal at this time.    Last substance used: Heroin, 2-3 weeks ago  If last substance not an opioid, last opioid used (type, dose, route, withdrawal symptoms): Heroin, 2-3 weeks ago.   Mental Health History: reports none  Current counseling/behavioural health provider: none  This patient has Opioid Use Disorder by following DSM-V criteria: Keep those that apply.  - Opioids taken in larger amounts or over a longer period than intended - Persistent desire to cut down - A great deal of time is spent to obtain/use/recover from the opioid - Cravings to use opioids - Use resulting in a failure to fulfill  major role obligations - Continue opioid use despite persistent social or interpersonal problems - Important activities are given up or reduced because of opioid use - Recurrent opioid use in situations in which it is physically hazardous - Use despite knowledge of health problems caused by opioids - Tolerance - Withdrawal  Past Medical History:  Diagnosis Date  . Asthma    regular inhaler use    Current Outpatient Medications on File Prior to Visit  Medication Sig Dispense Refill  . albuterol (PROVENTIL HFA;VENTOLIN HFA) 108 (90 Base) MCG/ACT inhaler Inhale 1-2 puffs into the lungs every 6 (six) hours as needed for wheezing or shortness of breath. 1 Inhaler 0  . Buprenorphine HCl-Naloxone HCl (SUBOXONE) 4-1 MG FILM Place 1 strip under the tongue 2 (two) times daily. 4 each 0  . cetirizine (ZYRTEC ALLERGY) 10 MG tablet Take 1 tablet (10 mg total) by mouth daily. (Patient not taking: Reported on 04/17/2018) 30 tablet 0  . ibuprofen (ADVIL,MOTRIN) 100 MG/5ML suspension Take 30 mLs (600 mg total) by mouth every 6 (six) hours as needed for moderate pain. 600 mL 3  . Prenatal Vit-Fe Fumarate-FA (PRENATAL MULTIVITAMIN) TABS tablet Take 1 tablet by mouth daily at 12 noon.     No current facility-administered medications on file prior to visit.     Physical Exam  Vitals:   05/22/18 1136  BP: (!) 111/57  Pulse: 91  Temp: 98.1 F (36.7 C)  TempSrc: Oral  SpO2: 99%   General: Alert, awake, no acute distress Eyes: Anicteric sclerae, no injection CV: RR, no murmur Pulm: CTAB, no wheezing Ext: Thin, no edema Psych: Pleasant, positive Clinical Opiate Withdrawal Scale: bold applicable COWS scoring   - Resting HR:    - 0 for < 80   - 1 for 81 - 100   - 2 for 101 - 120   - 4 for > 120  - Sweating:   - 0 for no chills/flushing   - 1 for subjective chills/flushing   - 3 for beads of sweat on brow/face   - 4 for sweat streaming off of face  - Restlessness:    - 0 for able to sit  still   - 1 for subjective difficulty sitting still   - 3 for frequent shifting or extraneous movement   - 5 for unable to sit still for more than a few seconds  - Pupil size:    - 0 for pinpoint or normal   - 1 for possibly larger than normal   - 2 for moderately dilated   - 5 for only iris rim visible  - Bone/joint pain:    - 0 for not present   - 1 for mild diffuse discomfort   - 2 severe diffuse aching   - 4 for objectively rubbing joints/muscles and obviously in pain  - Runny nose/tearing:    - 0 for not present   - 1 for stuffy nose/moist eyes   - 2 for nose running/tearing   - 4 for nose constantly running or tears streaming down cheeks  - GI Upset:    - 0 for no GI symptoms   - 1 for stomach cramps   - 2 for nausea or loose stool   - 3 for vomiting or diarrhea   - 5 for multiple episodes of vomiting or diarrhea  - Tremor observation of outstretched hands:    - 0 for no tremor   - 1 for tremor can be felt but not observed   - 2 for slight tremor observable   - 4 for gross tremor or muscle twitching  - Yawning:    - 0 for no yawning   - 1 for yawning once or twice during assessment   - 2 for yawning three or more times during assessment   - 4 for yawning several times per minute  - Anxiety or irritability:    - 0 for none   - 1 for patient reports increasing irritability or anxiousness   - 2 for patient obviously irritable/anxious   - 4 for patient so irritable/anxious that assessment is difficult  - Gooseflesh:    - 0 for skin is smooth   - 3 for piloerection of skin can be felt or seen   - 5 for prominent piloerection  TOTAL: 2  Assessment/Plan:   Based on a review of the patient's medical history including substance use and mental health factors, and physical exam, Meredith Vega is a suitable candidate for office based opiate use disorder therapy with buprenorphine/naloxone.  UDS ordered this visit.    HIV negative on 03/12/18 HCV - consider screening,  she reports never injecting.  CMET from 04/24/18 showed normal LFTs  Home Induction:   I have instructed the patient how to appropriately take this medication, including placing under the tongue with head relaxed for 10 minutes and allowing to dissolve without chewing or swallowing tab/film, and with nothing  to eat or drink in the subsequent 15 minutes.   I have explained the concept of precipitated withdrawal with the patient.    Intervisit Care:  We discussed this medication must be kept in a safe place and away from children.   We will see the patient back in 1 week in clinic, with options for a sooner appointment based on patient and provider preference.   Patient was encouraged to call the office and speak with the MD on call for any urgent concerns.    Inez Catalina, MD 05/22/2018 11:42 AM

## 2018-05-23 NOTE — Assessment & Plan Note (Signed)
She has been taking buprenorphine 4-1mg  TID since giving birth to her child.  She reports good control of cravings, however, since she had difficulty getting to clinic she has been slowly decreasing her dose and has developed muscle aches, particularly neck and and headache.  She desires to be clean so that she can have a good relationship with her child.  Her PHQ-9 is 2 today.   Plan Will continue buprenorphine 4-1mg  TID for now, consider weaning to BID dosing in the future if she does well UDS today [place] CSRS reviewed and appropriate.  Return in 1 week for further evaluation and to review medication contract and sign.

## 2018-05-23 NOTE — BH Specialist Note (Signed)
Integrated Behavioral Health Initial Visit  MRN: [id number] Name: Meredith LenisHeather L Vega  Number of Integrated Behavioral Health Clinician visits:: 1/6 Session Start time: 12:00  Session End time: 12:23 Total time: 23 minutes  Type of Service: Integrated Behavioral Health- Individual/Family Interpretor:No.     Warm Hand Off Completed. Yes, by Dr. Criselda PeachesMullen      SUBJECTIVE: Meredith Vega is a 27 y.o. female  whom attended the session individually.  Patient was referred by Dr. Criselda PeachesMullen and Ernest HaberJasmine Williams for case management and substance use counseling. Patient reports the following symptoms/concerns: adjustment to being a new mother, stress around CPS case, and learning to cope with stress without substances. History of trauma.  Duration of problem: increased over the past month, SA usage for over three years; Severity of problem: moderate  OBJECTIVE: Mood: Anxious and Affect: Appropriate Risk of harm to self or others: No plan to harm self or others  LIFE CONTEXT: Family and Social: Patient lives with an older lady that is not related to her (refers to her as a mother figure). The patient's mother is deceased, and her father does not live locally. Patient has a boyfriend. Patient is a new mother, and the baby is currently living with her (CPS is requiring supervision).  School/Work: Patient is not currently working or attending school. Patient has hopes to return to school after working on her sobriety, and her CPS case is closed.  Self-Care: Needs improvement.  Life Changes: New mother. Newly sober.   GOALS ADDRESSED: Patient will: 1. Reduce symptoms of: anxiety, stress and unhealthy coping skills.  2. Increase knowledge and/or ability of: coping skills, healthy habits, stress reduction and remain sober and follow her plan of care.   3. Demonstrate ability to: Increase healthy adjustment to current life circumstances, Increase adequate support systems for patient/family, Increase  motivation to adhere to plan of care and Decrease self-medicating behaviors  INTERVENTIONS: Interventions utilized: Motivational Interviewing and Supportive Counseling  Standardized Assessments completed: assessed for SI, HI, and self-harm.   ASSESSMENT: Patient currently experiencing stress related to open CPS case, adjustment to motherhood, history of trauma, lack of natural supports, and adjustment to remaining off of substances. Patient is also attending SAIOP groups at [facility] as a form of SA treatment.    Patient may benefit from continuing to attend SAIOP groups along with outpatient therapy.  PLAN: 1. Follow up with behavioral health clinician on : one week.  2. Behavioral recommendations: Continue with SAIOP groups.  3. Referral(s): [facility] [place], [place]

## 2018-05-29 ENCOUNTER — Encounter: Payer: Self-pay | Admitting: Licensed Clinical Social Worker

## 2018-05-29 ENCOUNTER — Ambulatory Visit (INDEPENDENT_AMBULATORY_CARE_PROVIDER_SITE_OTHER): Payer: Medicaid Other | Admitting: Internal Medicine

## 2018-05-29 ENCOUNTER — Other Ambulatory Visit: Payer: Self-pay

## 2018-05-29 DIAGNOSIS — F1120 Opioid dependence, uncomplicated: Secondary | ICD-10-CM

## 2018-05-29 DIAGNOSIS — F1121 Opioid dependence, in remission: Secondary | ICD-10-CM

## 2018-05-29 LAB — TOXASSURE SELECT,+ANTIDEPR,UR

## 2018-05-29 MED ORDER — BUPRENORPHINE HCL-NALOXONE HCL 4-1 MG SL FILM: 1.0000 | ORAL_FILM | Freq: Three times a day (TID) | SUBLINGUAL | 0 refills | Status: DC

## 2018-05-29 NOTE — Progress Notes (Signed)
   05/29/2018  Meredith LenisHeather L Vega presents for follow up of opioid use disorder I have reviewed the prior induction visit, follow up visits, and telephone encounters relevant to opiate use disorder (OUD) treatment.   Current daily dose: Suboxone 12mg  daily in divided doses  Date of Induction: 04/24/18 by OB at hospitalization for delivery  Current follow up interval, in weeks: 1 week  The patient has been adherent with the buprenorphine for OUD contract.   Last UDS Result: not yet returned  HPI: Meredith Vega is a 27 year old female with polysubstance abuse including OUD- moderate.  She was started on Subutex during her admission for L&D of her first child.  CPS is involved but she reports baby is currently doing well.  She plans to start [facility] classes on next Monday.  [facility] Sem[facility] hicks is also involved and seeing her.  She reports overall she has done well on suboxone 4-1mg  films TID, she did not one episode of intense craving for opioids but used a strip early instead.  She is very motivated to remain in treatment and enjoys being a mom.  Exam:   Vitals:   05/29/18 1148  BP: 124/88  Pulse: 100  Temp: 98.2 F (36.8 C)  TempSrc: Oral  SpO2: 100%  Weight: 152 lb 1.6 oz (69 kg)   General: good eye contact, calm Cardiac: RRR, no  murmurs Pulm: clear to auscultation bilaterally, no wheezing Abd: soft, nontender, nondistended, BS present   Assessment/Plan:  See Problem Based Charting in the Encounters Tab     Gust RungHoffman, Louann Hopson C, DO  05/29/2018  1:44 PM

## 2018-05-29 NOTE — Assessment & Plan Note (Signed)
Overall doing well with Suboxone, I think we will continue on suboxone 4-1mg  films TID.  If cravings persist we may have to increase to 16mg  TDD but overall seems to be well controlled.  UDS from last week not yet back. Follow up in 2 weeks.

## 2018-06-04 ENCOUNTER — Ambulatory Visit: Payer: Medicaid Other | Admitting: Obstetrics and Gynecology

## 2018-06-06 ENCOUNTER — Telehealth: Payer: Self-pay | Admitting: Obstetrics and Gynecology

## 2018-06-11 ENCOUNTER — Encounter: Payer: Self-pay | Admitting: Obstetrics and Gynecology

## 2018-06-12 ENCOUNTER — Other Ambulatory Visit: Payer: Self-pay

## 2018-06-12 ENCOUNTER — Ambulatory Visit (INDEPENDENT_AMBULATORY_CARE_PROVIDER_SITE_OTHER): Payer: Medicaid Other | Admitting: Student in an Organized Health Care Education/Training Program

## 2018-06-12 DIAGNOSIS — F1121 Opioid dependence, in remission: Secondary | ICD-10-CM

## 2018-06-12 DIAGNOSIS — R21 Rash and other nonspecific skin eruption: Secondary | ICD-10-CM | POA: Insufficient documentation

## 2018-06-12 DIAGNOSIS — F1120 Opioid dependence, uncomplicated: Secondary | ICD-10-CM

## 2018-06-12 MED ORDER — BUPRENORPHINE HCL-NALOXONE HCL 4-1 MG SL FILM: 1.0000 | ORAL_FILM | Freq: Three times a day (TID) | SUBLINGUAL | 0 refills | Status: DC

## 2018-06-12 MED ORDER — TRIAMCINOLONE ACETONIDE 0.1 % EX CREA: 1.0000 "application " | TOPICAL_CREAM | Freq: Two times a day (BID) | CUTANEOUS | 0 refills | Status: DC

## 2018-06-12 NOTE — Assessment & Plan Note (Signed)
Opioid use disorder seems to be well treated.  Plan is to continue with Suboxone 4 mg film 3 times daily.  Checking urine substance test today.  Follow-up in 3 weeks.

## 2018-06-12 NOTE — Assessment & Plan Note (Signed)
Rash today on her lower abdomen seems most consistent with contact dermatitis.  Rash maps well to the waistband of her pants.  Very inflammatory.  Plan is to treat with topical triamcinolone 0.1% cream twice daily.  Do not think this is a drug reaction to the Suboxone, but advised that she call me if there is any change in the rash.

## 2018-06-12 NOTE — Patient Instructions (Signed)
I refilled her Suboxone for 3-week supply.  Follow-up with us on January 7.  For your rash I prescribed a topical steroid.  Use this twice a day.  Call me if the rash gets worse.

## 2018-06-12 NOTE — Progress Notes (Signed)
   06/12/2018  Meredith Vega presents for follow up of opioid use disorder I have reviewed the prior induction visit, follow up visits, and telephone encounters relevant to opiate use disorder (OUD) treatment.   Current daily dose: Suboxone 12 mg daily in divided doses  Date of Induction: 04/24/18  Current follow up interval, in weeks: 2  The patient has been adherent with the buprenorphine for OUD contract.   HPI: 27 year old woman here for follow-up of severe opioid use disorder.  She gave birth to her daughter about 1 month ago.  Reports good compliance with medication, no adverse side effects.  Reports her cravings are fairly well controlled.  Denies any relapses, no drug use.  Says she has had some increased stress over the last few weeks, child protective services given full custody of the baby to the baby's father.  Patient still visits with the baby few days a week under supervision.  She is acute complaint today of a rash over her lower abdomen over the last 1 week.  It is itchy.  Denies any new soaps or new clothing.  No fevers or chills.  No lesions in her mouth or other mucous membranes.  No rash on her trunk or back.  Exam:   Vitals:   06/12/18 1151  BP: 112/85  Pulse: 93  Temp: 98 F (36.7 C)  TempSrc: Oral  SpO2: 100%  Weight: 151 lb 14.4 oz (68.9 kg)    General: Well-appearing woman in no distress Skin: There is an inflammatory papular rash on the lower abdomen right underneath the waistband of her pants, the underlying skin is erythematous, there is no vesicles, there is no purulence, nonblanching, looks very inflammatory, poorly demarcated Heart is regular rate and rhythm with no murmurs Abdomen is soft and nontender, well-healed midline incision Extremities are warm and well-perfused no edema  Assessment/Plan:  See Problem Based Charting in the Encounters Tab     Meredith Vega, Meredith Alleyne Thomas, MD  06/12/2018  2:53 PM

## 2018-06-16 LAB — TOXASSURE SELECT,+ANTIDEPR,UR

## 2019-06-28 DIAGNOSIS — F1910 Other psychoactive substance abuse, uncomplicated: Secondary | ICD-10-CM

## 2019-06-28 HISTORY — DX: Other psychoactive substance abuse, uncomplicated: F19.10

## 2019-06-28 NOTE — L&D Delivery Note (Signed)
Delivery Note  Upon my arrival to [facility]  ER room, pt noted to be C /C/+2. With maternal pushing delivered a viable female infant without problems. Time as recorded. Apgars as recorded. Infant to mother's chest, with good cry and color noted. Cord clamped and cut after 1 minute delay. Intact placenta, 3 vessel cord via gentle traction. Placenta to pathology. Small periureteral laceration no bleeding and not repaired.   Anesthesia:   None Episiotomy:  NA Lacerations:  As noted above Suture Repair: NA Est. Blood Loss (mL):  250 cc  Mom to postpartum.  Baby to Nursery.  Hermina Staggers 12/04/2019, 9:36 PM

## 2019-12-04 ENCOUNTER — Inpatient Hospital Stay (HOSPITAL_COMMUNITY)
Admission: EM | Admit: 2019-12-04 | Discharge: 2019-12-09 | DRG: 806 | Disposition: A | Discharge status: Home / Self Care | Payer: Medicaid Other | Attending: Obstetrics and Gynecology | Admitting: Obstetrics and Gynecology

## 2019-12-04 DIAGNOSIS — F1290 Cannabis use, unspecified, uncomplicated: Secondary | ICD-10-CM | POA: Diagnosis present

## 2019-12-04 DIAGNOSIS — O99324 Drug use complicating childbirth: Secondary | ICD-10-CM | POA: Diagnosis present

## 2019-12-04 DIAGNOSIS — F1490 Cocaine use, unspecified, uncomplicated: Secondary | ICD-10-CM | POA: Diagnosis present

## 2019-12-04 DIAGNOSIS — F1123 Opioid dependence with withdrawal: Secondary | ICD-10-CM | POA: Diagnosis present

## 2019-12-04 DIAGNOSIS — Z20822 Contact with and (suspected) exposure to covid-19: Secondary | ICD-10-CM | POA: Diagnosis present

## 2019-12-04 DIAGNOSIS — O9952 Diseases of the respiratory system complicating childbirth: Secondary | ICD-10-CM | POA: Diagnosis present

## 2019-12-04 DIAGNOSIS — O9902 Anemia complicating childbirth: Secondary | ICD-10-CM | POA: Diagnosis present

## 2019-12-04 DIAGNOSIS — Z3A40 40 weeks gestation of pregnancy: Secondary | ICD-10-CM | POA: Diagnosis not present

## 2019-12-04 DIAGNOSIS — O99334 Smoking (tobacco) complicating childbirth: Secondary | ICD-10-CM | POA: Diagnosis present

## 2019-12-04 DIAGNOSIS — Z3490 Encounter for supervision of normal pregnancy, unspecified, unspecified trimester: Secondary | ICD-10-CM

## 2019-12-04 DIAGNOSIS — Z30017 Encounter for initial prescription of implantable subdermal contraceptive: Secondary | ICD-10-CM | POA: Diagnosis not present

## 2019-12-04 DIAGNOSIS — F17210 Nicotine dependence, cigarettes, uncomplicated: Secondary | ICD-10-CM | POA: Diagnosis present

## 2019-12-04 DIAGNOSIS — Z3009 Encounter for other general counseling and advice on contraception: Secondary | ICD-10-CM

## 2019-12-04 DIAGNOSIS — F1190 Opioid use, unspecified, uncomplicated: Secondary | ICD-10-CM | POA: Diagnosis present

## 2019-12-04 DIAGNOSIS — O0930 Supervision of pregnancy with insufficient antenatal care, unspecified trimester: Secondary | ICD-10-CM

## 2019-12-04 DIAGNOSIS — O0933 Supervision of pregnancy with insufficient antenatal care, third trimester: Secondary | ICD-10-CM

## 2019-12-04 DIAGNOSIS — J45909 Unspecified asthma, uncomplicated: Secondary | ICD-10-CM | POA: Diagnosis present

## 2019-12-04 DIAGNOSIS — Z789 Other specified health status: Secondary | ICD-10-CM

## 2019-12-04 DIAGNOSIS — F1110 Opioid abuse, uncomplicated: Secondary | ICD-10-CM | POA: Diagnosis present

## 2019-12-04 DIAGNOSIS — Z3A00 Weeks of gestation of pregnancy not specified: Secondary | ICD-10-CM

## 2019-12-04 DIAGNOSIS — O26893 Other specified pregnancy related conditions, third trimester: Secondary | ICD-10-CM | POA: Diagnosis present

## 2019-12-04 DIAGNOSIS — F1121 Opioid dependence, in remission: Secondary | ICD-10-CM

## 2019-12-04 MED ORDER — FENTANYL CITRATE (PF) 100 MCG/2ML IJ SOLN
100.0000 ug | Freq: Once | INTRAMUSCULAR | Status: AC
  Administered 2019-12-04: 100 ug via INTRAVENOUS

## 2019-12-04 MED ORDER — MISOPROSTOL 200 MCG PO TABS
600.0000 ug | ORAL_TABLET | Freq: Once | ORAL | Status: AC
  Administered 2019-12-04: 600 ug via ORAL
  Filled 2019-12-04: qty 3

## 2019-12-04 MED ORDER — FENTANYL CITRATE (PF) 100 MCG/2ML IJ SOLN
INTRAMUSCULAR | Status: AC
  Filled 2019-12-04: qty 2

## 2019-12-04 NOTE — ED Notes (Signed)
[facility] dispatch notified for need of transport.  

## 2019-12-04 NOTE — ED Provider Notes (Signed)
[facility]-EMERGENCY DEPT Provider Note   CSN: [id number] Arrival date & time: 12/04/19  2029     History Chief Complaint  Patient presents with  . Possible Pregnancy    Meredith Vega is a 29 y.o. female.  Hx limited due to acuity, level 5.  G2 P1 at 8 months by patient's estimation, unsure of dates.  Patient received no prenatal care.  She has history of substance abuse, states that her initial daughter was taken away from her.  She said she has been having contractions intermittently over the last few hours, felt gush of fluids approximately 1 hour prior to arrival.  Contractions occurring approximately every 10 minutes.  No vaginal bleeding.  Denies any known complications with the last pregnancy or delivery.  She denies any other medical conditions.  HPI     Past Medical History:  Diagnosis Date  . Asthma    regular inhaler use    Patient Active Problem List   Diagnosis Date Noted  . Rash 06/12/2018  . Opioid use disorder, moderate, in early remission (HCC) 04/09/2018    Past Surgical History:  Procedure Laterality Date  . WISDOM TOOTH EXTRACTION       OB History    Gravida  2   Para  1   Term  1   Preterm      AB      Living  1     SAB      TAB      Ectopic      Multiple  0   Live Births  1           Family History  Problem Relation Age of Onset  . Cirrhosis Mother     Social History   Tobacco Use  . Smoking status: Current Some Day Smoker    Packs/day: 1.00    Types: Cigarettes  . Smokeless tobacco: Never Used  . Tobacco comment: cutting back  Substance Use Topics  . Alcohol use: No  . Drug use: Yes    Types: Marijuana, Heroin, Cocaine    Comment: last cocaine use in september;  heroin use 10/28    Home Medications Prior to Admission medications   Medication Sig Start Date End Date Taking? Authorizing Provider  albuterol (PROVENTIL HFA;VENTOLIN HFA) 108 (90 Base) MCG/ACT inhaler Inhale 1-2 puffs  into the lungs every 6 (six) hours as needed for wheezing or shortness of breath. 03/18/18   Palumbo, April, MD  Buprenorphine HCl-Naloxone HCl (SUBOXONE) 4-1 MG FILM Place 1 Film under the tongue 3 (three) times daily. 06/12/18   Tyson Alias, MD  ibuprofen (ADVIL,MOTRIN) 100 MG/5ML suspension Take 30 mLs (600 mg total) by mouth every 6 (six) hours as needed for moderate pain. 04/27/18   Anyanwu, Jethro Bastos, MD  Prenatal Vit-Fe Fumarate-FA (PRENATAL MULTIVITAMIN) TABS tablet Take 1 tablet by mouth daily at 12 noon.    [provider]  triamcinolone cream (KENALOG) 0.1 % Apply 1 application topically 2 (two) times daily. 06/12/18   Tyson Alias, MD    Allergies    Ceclor [cefaclor]  Review of Systems   Review of Systems  Unable to perform ROS: Acuity of condition    Physical Exam Updated Vital Signs BP 132/74 (BP Location: Left Arm)   Pulse 60   Temp (!) 97.5 F (36.4 C) (Oral)   Resp (!) 25   SpO2 100%   Physical Exam Vitals and nursing note reviewed.  Constitutional:  General: She is not in acute distress.    Appearance: She is well-developed.  HENT:     Head: Normocephalic and atraumatic.  Eyes:     Conjunctiva/sclera: Conjunctivae normal.  Cardiovascular:     Rate and Rhythm: Normal rate and regular rhythm.     Heart sounds: No murmur heard.   Pulmonary:     Effort: Pulmonary effort is normal. No respiratory distress.     Breath sounds: Normal breath sounds.  Abdominal:     Palpations: Abdomen is soft.     Tenderness: There is no abdominal tenderness.     Comments: Gravid abdomen  Genitourinary:    Comments: Cervical check: 10/100/+1 Musculoskeletal:     Cervical back: Neck supple.  Skin:    General: Skin is warm and dry.  Neurological:     Mental Status: She is alert.     ED Results / Procedures / Treatments   Labs (all labs ordered are listed, but only abnormal results are displayed) Labs Reviewed - No data to  display  EKG None  Radiology No results found.  Procedures .Critical Care Performed by: Lucrezia Starch, MD Authorized by: Lucrezia Starch, MD   Critical care provider statement:    Critical care time (minutes):  60   Critical care was necessary to treat or prevent imminent or life-threatening deterioration of the following conditions: active labor.   Critical care was time spent personally by me on the following activities:  Discussions with consultants, evaluation of patient's response to treatment, examination of patient, ordering and performing treatments and interventions, ordering and review of laboratory studies, ordering and review of radiographic studies, pulse oximetry, re-evaluation of patient's condition, obtaining history from patient or surrogate and review of old charts   (including critical care time)  Medications Ordered in ED Medications  ibuprofen (ADVIL) tablet 600 mg (600 mg Oral Given 12/05/19 0514)  acetaminophen (TYLENOL) tablet 650 mg (650 mg Oral Given 12/05/19 1039)  zolpidem (AMBIEN) tablet 5 mg (has no administration in time range)  diphenhydrAMINE (BENADRYL) capsule 25 mg (has no administration in time range)  senna-docusate (Senokot-S) tablet 2 tablet (has no administration in time range)  simethicone (MYLICON) chewable tablet 80 mg (has no administration in time range)  ondansetron (ZOFRAN) tablet 4 mg (has no administration in time range)    Or  ondansetron (ZOFRAN) injection 4 mg (has no administration in time range)  prenatal multivitamin tablet 1 tablet (has no administration in time range)  witch hazel-glycerin (TUCKS) pad 1 application (has no administration in time range)    And  dibucaine (NUPERCAINAL) 1 % rectal ointment 1 application (has no administration in time range)  benzocaine-Menthol (DERMOPLAST) 20-0.5 % topical spray 1 application (1 application Topical Given 12/05/19 1027)  coconut oil (has no administration in time range)   Tdap (BOOSTRIX) injection 0.5 mL (has no administration in time range)  lactated ringers infusion (has no administration in time range)  oxyCODONE (Oxy IR/ROXICODONE) immediate release tablet 5 mg (5 mg Oral Given 12/05/19 0854)  oxyCODONE (Oxy IR/ROXICODONE) immediate release tablet 10 mg (has no administration in time range)  fentaNYL (SUBLIMAZE) 100 MCG/2ML injection (has no administration in time range)  oxytocin (PITOCIN) IV infusion 30 units in NS 500 mL - Premix (has no administration in time range)  ferrous sulfate tablet 325 mg (325 mg Oral Given 12/05/19 1027)  nicotine (NICODERM CQ - dosed in mg/24 hours) patch 21 mg (21 mg Transdermal Patch Applied 12/05/19 0853)  methadone (DOLOPHINE) tablet  20 mg (has no administration in time range)  etonogestrel (NEXPLANON) implant 68 mg (has no administration in time range)  lidocaine (XYLOCAINE) 1 % (with pres) injection 0-20 mL (has no administration in time range)  fentaNYL (SUBLIMAZE) injection 100 mcg (100 mcg Intravenous Given 12/04/19 2140)  misoprostol (CYTOTEC) tablet 600 mcg (600 mcg Oral Given 12/04/19 2300)  methadone (DOLOPHINE) tablet 20 mg (20 mg Oral Given 12/05/19 0246)  oxytocin (PITOCIN) IV BOLUS FROM BAG (500 mLs Intravenous Bolus from Bag 12/04/19 2240)    ED Course  I have reviewed the triage vital signs and the nursing notes.  Pertinent labs & imaging results that were available during my care of the patient were reviewed by me and considered in my medical decision making (see chart for details).    MDM Rules/Calculators/A&P                      29 year old lady G2, P1 presenting to ER in active labor.  Patient having active contractions, water broke prior to arrival.  She had received no prenatal care.  On cervical check, patient is fully dilated, proceeded to prepare for delivery in ER.  OB RN at bedside soon after arrival, and later [facility] attending came to bedside.  He performed the delivery.  NICU response team also came to  bedside and was available, thankfully patient's newborn baby boy was well-appearing, appeared grossly full term, Apgars within normal limits as documented, vigorous cry.  No immediate complications with delivery. Placenta delivery without immediate complications. Intact. Pt received pitocin.  Patient and baby transferred to [facility], Dr. Alysia Penna attending.  Final Clinical Impression(s) / ED Diagnoses Final diagnoses:  Admitted to labor and delivery  Insufficient prenatal care in third trimester  Pregnancy, unspecified gestational age    Rx / DC Orders ED Discharge Orders    None       Milagros Loll, MD 12/05/19 1131

## 2019-12-04 NOTE — ED Notes (Addendum)
Rapid OB contacted. States she is on her way, unknown etiology.

## 2019-12-04 NOTE — ED Notes (Signed)
Meredith Kern, MD Meredith Plate, MD OB At bedside.

## 2019-12-04 NOTE — H&P (Signed)
Meredith Vega is a 29 y.o.G2P1001 female presenting for to [facility] ED with no prenatal care in active labor at term. H/O drug use, last use yesterday. OB History    Gravida  2   Para  1   Term  1   Preterm      AB      Living  1     SAB      TAB      Ectopic      Multiple  0   Live Births  1          Past Medical History:  Diagnosis Date  . Asthma    regular inhaler use   Past Surgical History:  Procedure Laterality Date  . WISDOM TOOTH EXTRACTION     Family History: family history includes Cirrhosis in her mother. Social History:  reports that she has been smoking cigarettes. She has been smoking about 1.00 pack per day. She has never used smokeless tobacco. She reports current drug use. Drugs: Marijuana, Heroin, and Cocaine. She reports that she does not drink alcohol.      Review of Systems  Constitutional: Negative.   Respiratory: Negative.   Cardiovascular: Negative.   Gastrointestinal: Negative.   Endocrine: Negative.   Genitourinary: Positive for pelvic pain and vaginal bleeding.   History Dilation: 7.5 Blood pressure (!) 135/106, pulse 89, temperature (!) 97.5 F (36.4 C), temperature source Oral, resp. rate (!) 26, SpO2 98 %, not currently breastfeeding. Exam Physical Exam  Constitutional: She appears well-developed and well-nourished.  Cardiovascular: Normal rate and regular rhythm.  Respiratory: Effort normal and breath sounds normal.  GI: Soft. Bowel sounds are normal.  gravid  Genitourinary:    Genitourinary Comments: C/C + 2     Prenatal labs: ABO, Rh:   Antibody:   Rubella:   RPR:    HBsAg:    HIV:    GBS:     Assessment/Plan: IUP ? Term Active labor No prenatal care H/O drug use  See delivery note for additional information. Recovered in [facility] ER and then transferred to [facility] mother baby.   Hermina Staggers 12/04/2019, 9:26 PM

## 2019-12-04 NOTE — ED Triage Notes (Addendum)
Pt arrives stating water broke approx 1 hour ago. No prenatal care. Pt states contractions about 15 minutes ago. No menstrual cycle since August/Sept. Pt has had fetal movement. Pt is heroin user cigarette smoker

## 2019-12-05 ENCOUNTER — Encounter (HOSPITAL_COMMUNITY): Payer: Self-pay | Admitting: Obstetrics and Gynecology

## 2019-12-05 DIAGNOSIS — Z30017 Encounter for initial prescription of implantable subdermal contraceptive: Secondary | ICD-10-CM

## 2019-12-05 DIAGNOSIS — F1190 Opioid use, unspecified, uncomplicated: Secondary | ICD-10-CM | POA: Diagnosis present

## 2019-12-05 DIAGNOSIS — Z3009 Encounter for other general counseling and advice on contraception: Secondary | ICD-10-CM

## 2019-12-05 DIAGNOSIS — F1110 Opioid abuse, uncomplicated: Secondary | ICD-10-CM | POA: Diagnosis present

## 2019-12-05 LAB — RPR: RPR Ser Ql: NONREACTIVE

## 2019-12-05 LAB — RAPID URINE DRUG SCREEN, HOSP PERFORMED
Amphetamines: NOT DETECTED
Barbiturates: NOT DETECTED
Benzodiazepines: NOT DETECTED
Cocaine: POSITIVE — AB
Opiates: POSITIVE — AB
Tetrahydrocannabinol: NOT DETECTED

## 2019-12-05 LAB — CBC
HCT: 27.3 % — ABNORMAL LOW (ref 36.0–46.0)
Hemoglobin: 9.3 g/dL — ABNORMAL LOW (ref 12.0–15.0)
MCH: 31.6 pg (ref 26.0–34.0)
MCHC: 34.1 g/dL (ref 30.0–36.0)
MCV: 92.9 fL (ref 80.0–100.0)
Platelets: 219 10*3/uL (ref 150–400)
RBC: 2.94 MIL/uL — ABNORMAL LOW (ref 3.87–5.11)
RDW: 13 % (ref 11.5–15.5)
WBC: 23.1 10*3/uL — ABNORMAL HIGH (ref 4.0–10.5)
nRBC: 0 % (ref 0.0–0.2)

## 2019-12-05 LAB — ABO/RH: ABO/RH(D): A POS

## 2019-12-05 LAB — TYPE AND SCREEN
ABO/RH(D): A POS
Antibody Screen: NEGATIVE

## 2019-12-05 LAB — HIV ANTIBODY (ROUTINE TESTING W REFLEX): HIV Screen 4th Generation wRfx: NONREACTIVE

## 2019-12-05 LAB — HEPATITIS PANEL, ACUTE
HCV Ab: NONREACTIVE
Hep A IgM: NONREACTIVE
Hep B C IgM: NONREACTIVE
Hepatitis B Surface Ag: NONREACTIVE

## 2019-12-05 LAB — SARS CORONAVIRUS 2 (TAT 6-24 HRS): SARS Coronavirus 2: NEGATIVE

## 2019-12-05 MED ORDER — METHADONE HCL 10 MG PO TABS
20.0000 mg | ORAL_TABLET | Freq: Once | ORAL | Status: AC
  Administered 2019-12-05: 20 mg via ORAL
  Filled 2019-12-05: qty 2

## 2019-12-05 MED ORDER — SENNOSIDES-DOCUSATE SODIUM 8.6-50 MG PO TABS
2.0000 | ORAL_TABLET | ORAL | Status: DC
  Administered 2019-12-06 – 2019-12-08 (×4): 2 via ORAL
  Filled 2019-12-05 (×4): qty 2

## 2019-12-05 MED ORDER — OXYCODONE HCL 5 MG PO TABS
5.0000 mg | ORAL_TABLET | ORAL | Status: DC | PRN
  Administered 2019-12-05 (×2): 5 mg via ORAL
  Filled 2019-12-05 (×2): qty 1

## 2019-12-05 MED ORDER — OXYTOCIN-SODIUM CHLORIDE 30-0.9 UT/500ML-% IV SOLN: 2.5000 [IU]/h | INTRAVENOUS | Status: DC

## 2019-12-05 MED ORDER — DIBUCAINE (PERIANAL) 1 % EX OINT: 1.0000 "application " | TOPICAL_OINTMENT | CUTANEOUS | Status: DC | PRN

## 2019-12-05 MED ORDER — ZOLPIDEM TARTRATE 5 MG PO TABS: 5.0000 mg | ORAL_TABLET | Freq: Every evening | ORAL | Status: DC | PRN

## 2019-12-05 MED ORDER — OXYTOCIN BOLUS FROM INFUSION
500.0000 mL | Freq: Once | INTRAVENOUS | Status: AC
  Administered 2019-12-04 (×2): 500 mL via INTRAVENOUS

## 2019-12-05 MED ORDER — OXYCODONE HCL 5 MG PO TABS: 10.0000 mg | ORAL_TABLET | ORAL | Status: DC | PRN

## 2019-12-05 MED ORDER — BENZOCAINE-MENTHOL 20-0.5 % EX AERO
1.0000 "application " | INHALATION_SPRAY | CUTANEOUS | Status: DC | PRN
  Administered 2019-12-05: 1 via TOPICAL
  Filled 2019-12-05: qty 56

## 2019-12-05 MED ORDER — PRENATAL MULTIVITAMIN CH: 1.0000 | ORAL_TABLET | Freq: Every day | ORAL | Status: DC

## 2019-12-05 MED ORDER — LIDOCAINE HCL 1 % IJ SOLN
0.0000 mL | Freq: Once | INTRAMUSCULAR | Status: AC | PRN
  Administered 2019-12-05: 20 mL via INTRADERMAL
  Filled 2019-12-05: qty 20

## 2019-12-05 MED ORDER — TETANUS-DIPHTH-ACELL PERTUSSIS 5-2.5-18.5 LF-MCG/0.5 IM SUSP
0.5000 mL | Freq: Once | INTRAMUSCULAR | Status: AC
  Administered 2019-12-07: 0.5 mL via INTRAMUSCULAR
  Filled 2019-12-05 (×2): qty 0.5

## 2019-12-05 MED ORDER — ACETAMINOPHEN 325 MG PO TABS
650.0000 mg | ORAL_TABLET | ORAL | Status: DC | PRN
  Administered 2019-12-05 – 2019-12-08 (×5): 650 mg via ORAL
  Filled 2019-12-05 (×6): qty 2

## 2019-12-05 MED ORDER — WITCH HAZEL-GLYCERIN EX PADS: 1.0000 "application " | MEDICATED_PAD | CUTANEOUS | Status: DC | PRN

## 2019-12-05 MED ORDER — ONDANSETRON HCL 4 MG PO TABS: 4.0000 mg | ORAL_TABLET | ORAL | Status: DC | PRN

## 2019-12-05 MED ORDER — DIPHENHYDRAMINE HCL 25 MG PO CAPS: 25.0000 mg | ORAL_CAPSULE | Freq: Four times a day (QID) | ORAL | Status: DC | PRN

## 2019-12-05 MED ORDER — FERROUS SULFATE 325 (65 FE) MG PO TABS
325.0000 mg | ORAL_TABLET | ORAL | Status: DC
  Administered 2019-12-05 – 2019-12-09 (×3): 325 mg via ORAL
  Filled 2019-12-05 (×3): qty 1

## 2019-12-05 MED ORDER — METHADONE HCL 10 MG PO TABS: 20.0000 mg | ORAL_TABLET | Freq: Every day | ORAL | Status: DC

## 2019-12-05 MED ORDER — LACTATED RINGERS IV SOLN: INTRAVENOUS | Status: DC

## 2019-12-05 MED ORDER — ONDANSETRON HCL 4 MG/2ML IJ SOLN: 4.0000 mg | INTRAMUSCULAR | Status: DC | PRN

## 2019-12-05 MED ORDER — NICOTINE 21 MG/24HR TD PT24
21.0000 mg | MEDICATED_PATCH | Freq: Every day | TRANSDERMAL | Status: DC
  Administered 2019-12-05: 21 mg via TRANSDERMAL
  Filled 2019-12-05 (×2): qty 1

## 2019-12-05 MED ORDER — COCONUT OIL OIL: 1.0000 "application " | TOPICAL_OIL | Status: DC | PRN

## 2019-12-05 MED ORDER — ETONOGESTREL 68 MG ~~LOC~~ IMPL
68.0000 mg | DRUG_IMPLANT | Freq: Once | SUBCUTANEOUS | Status: AC
  Administered 2019-12-05: 68 mg via SUBCUTANEOUS
  Filled 2019-12-05: qty 1

## 2019-12-05 MED ORDER — COMPLETENATE 29-1 MG PO CHEW
1.0000 | CHEWABLE_TABLET | Freq: Every day | ORAL | Status: DC
  Administered 2019-12-05 – 2019-12-08 (×4): 1 via ORAL
  Filled 2019-12-05 (×4): qty 1

## 2019-12-05 MED ORDER — SIMETHICONE 80 MG PO CHEW: 80.0000 mg | CHEWABLE_TABLET | ORAL | Status: DC | PRN

## 2019-12-05 MED ORDER — IBUPROFEN 200 MG PO TABS
600.0000 mg | ORAL_TABLET | Freq: Four times a day (QID) | ORAL | Status: DC
  Administered 2019-12-05 – 2019-12-09 (×17): 600 mg via ORAL
  Filled 2019-12-05 (×18): qty 3

## 2019-12-05 NOTE — Progress Notes (Addendum)
4:41pm-CSW updated by CPS worker M.. Ellerby that she is still workig with [name]. CPS worker to call CSW on tomorrow with further updates. CSW updated staff of this at this time.    4:03pm-CSW escorted CPS worker to [name]'s room at this time.   3:47pm-CSW received call back from M. Ellerby informing CSW that she is on the way to the hospital to speak with [name]. CSW has updated [name] of this at this time.   2:47pm-CSW spoke with [name] at bedside to help assist with locating Methadone clinic. [name] reported to this CSW that she has been active with [facility] [place] in the past. [name] reported a desire to be established with this agency. CSW reached out to ADS to see about getting an appointment for [name] in which CSW and [name] were advised that no appointments are given until pt has completed the walk in portion of the program. CSW inquired on if facility was open on the weekend in which facility is but only for dosing per representative. CSW understanding and at this time [name] reports that she has plans to follow up on Monday 12/09/19 with [facility] for further needs. CSW has updated OB at this time of this.   CSW has asked if OB/staff are able to give [name] prescription for Methadone until Monday, OB looking into further ability to do so.   CSW received call from P. Miller that case was assigned to M. Ellerby [phone]. CSW reached out to CPS worker and was advised that she would call CSW back shortly.    Meredith Vega S. Meredith Vega, MSW, LCSW [facility] [place] [phone]  

## 2019-12-05 NOTE — Discharge Summary (Signed)
Postpartum Discharge Summary     Patient Name: Meredith Vega DOB: [birth year] MRN: [id number]  Date of admission: 12/04/2019 Delivery date:12/04/2019  Delivering provider: Chancy Milroy  Date of discharge: 12/09/2019  Admitting diagnosis: Postpartum care following vaginal delivery [Z39.2] Admitted to labor and delivery [Z78.9] Insufficient prenatal care in third trimester [O09.33] Pregnancy, unspecified gestational age [Z51.90] Intrauterine pregnancy: [redacted]w[redacted]d    Secondary diagnosis:  Active Problems:   Postpartum care following vaginal delivery   Opioid use disorder ([place]   H[place]in abuse complicating pregnancy ([place]   Unwanted fertility  Additional problems: Opioid use disorder    Discharge diagnosis: Term Pregnancy Delivered                                              Post partum procedures:  Nexplanon Augmentation:  None Complications: None  Hospital course: Onset of Labor With Vaginal Delivery      29y.o. yo G2P1002 at 301w2das admitted postpartum after having a NSVD in [facility] on 12/04/2019.   Patient had an uncomplicated labor course as follows:  Membrane Rupture Time/Date: 8:54 PM ,12/04/2019   Delivery Method:Vaginal, Spontaneous  Episiotomy: None  Lacerations:  Periurethral  Patient's postpartum course was complicated by initiation of methadone for opioid use disorder for which she stayed until PPD#5 when an appt could be made in the Methadone clinic. SW consulted with barriers for infant discharge. Nexplanon placed. Received Hep A vaccine.  She is ambulating, tolerating a regular diet, passing flatus, and urinating well. Patient is discharged home in stable condition on 12/09/19.  Newborn Data: Birth date:12/04/2019  Birth time:9:12 PM  Gender:Female  Living status:Living  Apgars:9 ,9  Weight:2773 g   Magnesium Sulfate received: No BMZ received: No Rhophylac:N/A MMR:Yes T-DaP:Given postpartum Flu: N/A Transfusion:No  Physical exam   Vitals:   12/08/19 0503 12/08/19 1442 12/08/19 1648 12/08/19 2206  BP: 118/88 (!) 123/104 103/63 119/80  Pulse: 86 92 87 92  Resp: _0 Temp: 98.1 F (36.7 C) 97.9 F (36.6 C)  97.7 F (36.5 C)  TempSrc: Oral Oral  Oral  SpO2: 100% 97%     General: alert, cooperative and no distress Lochia: appropriate Uterine Fundus: firm Incision: N/A DVT Evaluation: No evidence of DVT seen on physical exam. No cords or calf tenderness. No significant calf/ankle edema. Labs: Lab Results  Component Value Date   WBC 23.1 (H) 12/05/2019   HGB 9.3 (L) 12/05/2019   HCT 27.3 (L) 12/05/2019   MCV 92.9 12/05/2019   PLT 219 12/05/2019   CMP Latest Ref Rng & Units 04/24/2018  Glucose 70 - 99 mg/dL 93  BUN 6 - 20 mg/dL 11  Creatinine 0.44 - 1.00 mg/dL 0.76  Sodium 135 - 145 mmol/L 136  Potassium 3.5 - 5.1 mmol/L 4.0  Chloride 98 - 111 mmol/L 104  CO2 22 - 32 mmol/L 22  Calcium 8.9 - 10.3 mg/dL 8.8(L)  Total Protein 6.5 - 8.1 g/dL 6.7  Total Bilirubin 0.3 - 1.2 mg/dL 0.3  Alkaline Phos 38 - 126 U/L 131(H)  AST 15 - 41 U/L 16  ALT 0 - 44 U/L 15   Edinburgh Score: Edinburgh Postnatal Depression Scale Screening Tool 12/07/2019  I have been able to laugh and see the funny side of things. 0  I have looked forward with  enjoyment to things. 0  I have blamed myself unnecessarily when things went wrong. 1  I have been anxious or worried for no good reason. 2  I have felt scared or panicky for no good reason. 2  Things have been getting on top of me. 2  I have been so unhappy that I have had difficulty sleeping. 2  I have felt sad or miserable. 0  I have been so unhappy that I have been crying. 1  The thought of harming myself has occurred to me. 0  Edinburgh Postnatal Depression Scale Total 10     After visit meds:  Allergies as of 12/09/2019      Reactions   Ceclor [cefaclor] Hives      Medication List    STOP taking these medications   Buprenorphine HCl-Naloxone HCl 4-1 MG  Film Commonly known as: Suboxone   ibuprofen 100 MG/5ML suspension Commonly known as: ADVIL Replaced by: ibuprofen 600 MG tablet     TAKE these medications   acetaminophen 325 MG tablet Commonly known as: Tylenol Take 2 tablets (650 mg total) by mouth every 6 (six) hours as needed (for pain scale < 4).   albuterol 108 (90 Base) MCG/ACT inhaler Commonly known as: VENTOLIN HFA Inhale 1-2 puffs into the lungs every 6 (six) hours as needed for wheezing or shortness of breath.   ferrous sulfate 325 (65 FE) MG tablet Take 1 tablet (325 mg total) by mouth every other day.   ibuprofen 600 MG tablet Commonly known as: ADVIL Take 1 tablet (600 mg total) by mouth every 6 (six) hours. Replaces: ibuprofen 100 MG/5ML suspension   nicotine polacrilex 2 MG gum Commonly known as: NICORETTE Take 1 each (2 mg total) by mouth as needed for smoking cessation.   prenatal multivitamin Tabs tablet Take 1 tablet by mouth daily at 12 noon.   triamcinolone cream 0.1 % Commonly known as: KENALOG Apply 1 application topically 2 (two) times daily.        Discharge home in stable condition Infant Feeding: Bottle Infant Disposition: CPS to coordinate safe discharge  Discharge instruction: per After Visit Summary and Postpartum booklet. Activity: Advance as tolerated. Pelvic rest for 6 weeks.  Diet: routine diet Future Appointments:No future appointments. Follow up Visit:   Please schedule this patient for a In person postpartum visit in 4 weeks with the following provider: Any provider at [facility] Additional Postpartum F/U:  Behavioral Health/Substance Abuse Counseling   Low risk pregnancy complicated by:  Substance abuse disorder Delivery mode:  Vaginal, Spontaneous  Anticipated Birth Control:  Nexplanon   12/09/2019 Chauncey Mann, MD

## 2019-12-05 NOTE — Progress Notes (Addendum)
Post Partum Day 1  Subjective:  MACKLYN GLANDON is a 29 y.o. [id number] [redacted]w[redacted]d s/p vaginal delivery.  Patient was started on methadone 20mg  for opioid use.  Pt denies problems with ambulating, voiding or po intake.  She reports nausea but no vomiting, antiemetic helps to ease nausea.  Pain is moderately controlled.  She has had flatus. She has not had bowel movement.  Lochia Small.  Plan for birth control is  Nexplanon .  Method of Feeding: bottle.   Objective: BP 121/76 (BP Location: Left Arm)   Pulse 86   Temp 98.5 F (36.9 C) (Oral)   Resp 18   LMP 02/25/2019 (Within Weeks)   SpO2 100%   Breastfeeding Unknown   Physical Exam:  General: alert, cooperative and no distress Lochia:normal flow Chest: CTAB Heart: RRR no m/r/g Abdomen: +BS, soft, nontender, fundus firm at/below umbilicus Uterine Fundus: firm DVT Evaluation: No evidence of DVT seen on physical exam. Extremities: no LE  edema  Recent Labs    12/05/19 0347  HGB 9.3*  HCT 27.3*    Assessment/Plan:  ASSESSMENT: SHEVONNE WOLF is a 29 y.o. G2P1002 [redacted]w[redacted]d ppd #1 s/p NSVD doing well overall.  Polysubstance Use: continue methadone 20 mg daily, monitor for development of withdrawal symptoms, consult SW, will follow up SW recommendations, patient will need methadone clinic as outpatient   Plan for discharge tomorrow   LOS: 1 day   [redacted]w[redacted]d 12/05/2019, 10:21 AM    GME ATTESTATION:  I saw and evaluated the patient. I agree with the findings and the plan of care as documented in the resident's note with addition of the following:  Started on methadone 20 mg. Patient was offered Suboxone, but declined at she has tired- and failed- it in the past. Will discuss with AM team, SW will need to set her up with a Methadone clinic outpatient. She was advised that if she cannot be seen before the weekend, she may need to go home on suboxone.  Patient does want a Nexplanon for contraception. She has no insurance, so she will  most likely have to get the Nexplanon at [facility] outpatient. Will discuss with AM team to go over options.  [facility], DO OB Fellow, Faculty [facility], Center for [facility] 12/05/2019 8:52 PM

## 2019-12-05 NOTE — Progress Notes (Signed)
CSW went to assist [name] with getting Methadone services established. [name] reported to CSW that she doesn't stay in [facility] (Although [name] reported to CSW earlier that she did) and that she has been staying  at [facility] in [place] ([address] [place], [address]). CSW has made CPS report to [facility] CPS at this time for further concerns.   At this time barriers remain per [facility] CPS. CSW made new report.      Meredith Vega, MSW, LCSW [facility] [place] [phone]

## 2019-12-05 NOTE — Procedures (Signed)
GYNECOLOGY PROCEDURE NOTE  Meredith Vega is a 29 y.o. [id number] requesting Nexplanon insertion. No gynecologic concerns.  Nexplanon Insertion Procedure Patient identified, informed consent performed, consent signed. Patient does understand that irregular bleeding is a very common side effect of this medication. She was advised to have backup contraception for one week after placement. Appropriate time out taken. Patient's left arm was prepped and draped in the usual sterile fashion. The insertion area was measured and marked. Patient was prepped with alcohol swab and then injected with 3 ml of 1% lidocaine. The area was then prepped with betadine. Nexplanon removed from packaging and device confirmed present within needle, then inserted full length of needle and withdrawn per handbook instructions. Nexplanon was able to palpated in the patient's arm; patient palpated the insert herself. There was minimal blood loss. Patient insertion site covered with steri strip, guaze, and a pressure bandage to reduce any bruising. The patient tolerated the procedure well and was given post procedure instructions.

## 2019-12-05 NOTE — Discharge Instructions (Signed)

## 2019-12-05 NOTE — Progress Notes (Signed)
Patient seen at bedside. Reports that she uses up to 1g Heroin a day. Would like to try methadone instead of suboxone, as suboxone did not work for her in the past.  Discussed with Dr. Alysia Penna. Will start 20 mg once, then discuss with dr. Jolayne Panther in the morning.  Marlowe Alt, DO OB Fellow, Faculty Practice 12/05/2019 12:52 AM

## 2019-12-05 NOTE — Clinical Social Work Maternal (Addendum)
CLINICAL SOCIAL WORK MATERNAL/CHILD NOTE  Patient Details  Name: Meredith Vega MRN: [id number] Date of Birth: 08/27/1990  Date:  12/05/2019  Clinical Social Worker Initiating Note:  Brooklyn Jeff, LCSW Date/Time: Initiated:  12/05/19/0915     Child's Name:  [place]   Biological Parents:  Mother (Zanaiya Snapp)   Need for Interpreter:  None   Reason for Referral:  Late or No Prenatal Care , Current Substance Use/Substance Use During Pregnancy    Address:  [address] [place] [address]    Phone number:  [phone] (home)   Additional phone number: none   Household Members/Support Persons (HM/SP):   Household Member/Support Person 1   HM/SP Name Relationship DOB or Age  HM/SP -1 Morris Kerlin [name]   [birth year]  HM/SP -2   Stephen Eller  [name]'s boyfriend  [birth year] 1978  HM/SP -3        HM/SP -4        HM/SP -5        HM/SP -6        HM/SP -7        HM/SP -8          Natural Supports (not living in the home):      Professional Supports: None   Employment: Unemployed   Type of Work: none   Education:  9 to 11 years   Homebound arranged: No  Financial Resources:  Self-Pay    Other Resources:   ([name] reports that she needs to sign up for WIC/Food Stamps.)   Cultural/Religious Considerations Which May Impact Care:  none reported to this CSW.   Strengths:    [name] is aware of needed supports for her and infant.   Psychotropic Medications:    [name] reported that she is not on any medications at this time.    Pediatrician:     not yet chosen.   Pediatrician List:   [place]    [place]    [place]    [place]    [place]    [place]      Pediatrician Fax Number:    Risk Factors/Current Problems:  Substance Use , Basic Needs    Cognitive State:  Alert    Mood/Affect:  Relaxed , Comfortable , Calm    CSW Assessment: CSW consulted as [name] didn't receive PNC, hx of drug use, no custody of oldest  child as well as other concerns. CSW went to speak with [name] at bedside to address further needs.   CSW entered room and congratulated [name] on the birth of infant. CSW observed that there was another person in the room with  [name]. CSW inquired from [name] on if this was FOB? [name] reported that it wasn't and that "its my boyfriend, Stephen". CSW understanding and praised Stephen for being here with [name] to help support her. CSW advised [name] and Stephen of the HIPPA policy and asked that he leave the room. [name] and Stephen both agreeable to this. CSW advised [name] of CSW's role and the reason for CSW coming to visit with her. [name] reported that she decided to name infant [place] as "I had him in the ED at [place] [facility]". CSW understanding and verbalized to [name] that this was very clever of her. CSW asked [name] about her PNC. [name] reported that she did not get PNC as "I was really afraid to because I know what happened last time when I had my daughter". CSW asked   [name] to explain as CSW needed clarification. [name] reported that she knew she had been using substances throughout here pregnancy (as well as with last pregnancy) and was afraid to know what would happened. "I dreaded coming to the hospital but the pain was to much for me to handle at home". CSW inquired from [name] on what she would have done if she was able to handle the pain at home? [name] chuckled and looked a little puzzle. [name] reported "I know if sounds bad but I wouldn't have come to the hospital because I know everything that I went through last time with my daughter". CSW validated [name]'s concerns but also praised [name] for being brave enough to come to the hospital despite fears. [name] went on to tell CSW that she used heroin daily during this pregnancy as well as cocaine (2-3 days ago was lats use per [name]'s report) "every now and again. [name] also reported that she used THC with last use being 2-3 days ago. [name] expressed to this CSW that knew it was wrong but it was "habit as I have  used for almost 10 years". CSW asked [name] if she has ever been in any Substance Abuse facility treatment programs and [name] reported that's he hasn't. [name] reported a desire to receive Suboxone in the past but "was told that I wasn't eligible for the program as I don't have an ID". CSW reported to [name] that having a ID can be very important in getting into treatment facilities. CSW offered to assist [name] with getting SA treatment established as [name] reported that she had been started on Methadone while here. [name] went on to tell CSW that she lost her oldest daughter (Layla Eller- April 25, 2018) due to substances use and not "being clean". [name] reported that her daughter stay with her dad (Russell Vanden) in [place] but is unsure of address. [name] reported that [place] CPS was involved in this placement. [name] reported " I still have rights but he has custodial rights to her". CSW inquired from [name] on if she gets to see daughter much, in which [name] reported that she last seen daughter "about a year ago" CSW was very open with [name] about concerns for [name] and infant. CSW did advised [name] that with [name]'s hx with CPS and not having custody of daughter, as well as with reported substance use during this pregancy, CSW would be making a  CPS report. [name] appeared to be very understanding of this and reported to this CSW that she now lives in [place] and no longer in [place]. At this time CSW has made CPS report to Jennifer with [place] CPS. At this time CSW is awaiting call back from [place]  CPS with further updates on status of case, barriers to disch arge at this time.   CSW inquired from [name] on her mental health hx. [name] reported that she doesn't have any mental health hx. [name] reported that she has never been on any medications for mental health. CSW inquired from [name] on SI and HI in which [name] denies having these thoughts currently or in the past. [name] also reported no current or hx of DV. [name]  was asked if she had items to care for infant in which  [name] initially reported to this CSW that she has crib for infant to sleep in. [name] then reported that she doesn't have anything for infant. CSW reported that CSW would make CC4C refer to further assist   with needs in the event that [name] is able to take infant home. CSW reported to [name] that CSW would speak with  CSW in further details to see what plan is once report was made. [name] reported understanding and reported no other needs.    CSW took time to provide [name] with PPD and SIDS education. CSW provided [name] with PPD Checklist as well as SA resources. Therapy resources, and Methadone resources to further assist with [name]'s needs. [name] thanked CSW and reported no other needs at this time.   CSW will assist MO in locating Methadone clinic as well as continue  to follow for UDS of infant. CSW will have to make new CPS per [facility] CPS if UDS of infants is positive.    CSW Plan/Description:  Sudden Infant Death Syndrome (SIDS) Education, Perinatal Mood and Anxiety Disorder (PMADs) Education, CSW Will Continue to Monitor Umbilical Cord Tissue Drug Screen Results and Make Report if Warranted, Child Protective Service Report , [place], CSW Awaiting CPS Disposition Plan    Wetzel Bjornstad, [place] Aug 20, 2019, 10:00 AM

## 2019-12-05 NOTE — Progress Notes (Signed)
Spoke with patient at bedside to discuss treatment over weekend after discharge. Patient states that she is willing to take suboxone until 12/09/19 when she will be able to report to Methadone clinic for registration. Patient was previously prescribed suboxone 8 mg TID. Also discussed this with LCSWA, Hortencia Pilar, who recommended sending prescription to [facility] pharmacy for patient to have at the time of discharge. LCSWA will work with TOC on options to assist patient with costs due to no insurance coverage.

## 2019-12-05 NOTE — Progress Notes (Signed)
2031: OBRRN called to [facility] for pt unknown GA but "looks term". C/o ctx.   2045: OBRRN at bedside. Pt thrashing about in bed. ED Doctor states pt is around 7cm. OBRRN checked VE 10/100/+1.   2046: NICU team and Dr. Alysia Penna called and notified of pt 10/100/+1. Pt thrashing about in bed with little control. No PNC. Second baby unknown GA. FHR audible in 120s with variables. Trouble keeping infant on monitor.  2105: Dr. Alysia Penna at bedside for delivery.  2112: SVD of VMI placed to maternal abdomen via Dr. Alysia Penna.    2240: Dr. Alysia Penna called and notified of pt bleeding. Uterus firm with massage, passing multiple clots. Orders received for repeat bolus of pitocin, and cytotec 600mg  PO one time dose.   2245: Bladder noted to be distended. Pt placed on bedpan. Unable to void. Pt teaching performed on importance of emptying bladder. If continued inability will have to in and out cath.  2300: Pt to bedside commode. Voiding sufficient, peri care performed.  2316: F/F at 1 below. Small bleeding noted.   0005: [facility] at bedside to transfer to [facility] MBU 419.

## 2019-12-05 NOTE — Progress Notes (Signed)
CSW receive call back from [place] L with [facility] CPS. CSW was advised that report was not accepted per Victorino Dike L. CSW inquired on why case was not accepted as [name] reported substance use during this pregnancy and CSW was advised that without positives screens on infant, case was not accepted. CSW was advised to call back and make new report if infants UDS is positive. CSW will further monitor UDS.   At this time per Lise Auer, with [facility] case was not accepted which results in no barriers to infant discharging home with [name]. CSW voiced concerns to worker about maternal substances use and not having other child in her care, however CSW was still advised that case was not accepted.      Claude Manges [place], MSW, LCSW [facility] [place] [phone]

## 2019-12-06 LAB — HEPATITIS A ANTIBODY, TOTAL: hep A Total Ab: NONREACTIVE

## 2019-12-06 LAB — RUBELLA SCREEN: Rubella: 3.17 index (ref >0.99)

## 2019-12-06 MED ORDER — NALOXONE HCL 0.4 MG/ML IJ SOLN: 0.4000 mg | INTRAMUSCULAR | Status: DC | PRN

## 2019-12-06 MED ORDER — NICOTINE POLACRILEX 2 MG MT GUM
2.0000 mg | CHEWING_GUM | OROMUCOSAL | Status: DC | PRN
  Filled 2019-12-06: qty 1

## 2019-12-06 MED ORDER — METHADONE HCL 10 MG PO TABS: 20.0000 mg | ORAL_TABLET | Freq: Every day | ORAL | Status: DC

## 2019-12-06 MED ORDER — METHADONE HCL 10 MG PO TABS
10.0000 mg | ORAL_TABLET | Freq: Every day | ORAL | Status: DC
  Administered 2019-12-06 – 2019-12-09 (×4): 10 mg via ORAL
  Filled 2019-12-06 (×4): qty 1

## 2019-12-06 NOTE — Progress Notes (Addendum)
10:59am-CSW spoke with Meredith Vega and was advised that safety plan created on yesterday was based off Meredith Vega being discharged. CSW updated CPS worker that per OB, Meredith Vega would be staying here until appointment. CSW asked what plan would be since Meredith Vega is not beign discharge any longer. Per CPS there are still barriers to infant discharging and based on plan that was left for CSW from CPS, Meredith Vega is to provide proof that she went to [facility] on 6/14, Meredith Vega is to be supervised in nursey with infant (if discharged), as well as Meredith Vega is NOT allowed to take infant away from hospital. Meredith Vega reported that she understood this.   CSW and RN spoke with Meredith Vega at bedside about further concerns. CSW noted that Meredith Vega was asleep with infant in bed. RN moved infant from bed to basinet. CSW and RN spoke with Meredith Vega about incident of being outside to ensure that Meredith Vega was being safe when outside. Meredith Vega reported that seh steps outside to smoke a cigarette and nothing else. CSW encouraged Meredith Vega to not smoke if possible but also advised Meredith Vega that staff is working on getting Methadone needs met at this time. Meredith Vega reported that she understood and had no other questions.    CSW aware that CPS has met with Meredith Vega and created safety plan with Meredith Vega at this time. Per plan that was left for this CSW, Meredith Vega is to follow through the instruction that have been given to her via CPS. CSW is awaiting call back from CPS to determine further barriers to discharged.   CSW spoke with [facility] provider and was informed that plan at this time is to keep Meredith Vega in hospital until appointment. CSW will follow up with Meredith Vega for further needs.    Meredith Vega, MSW, LCSW [facility] [place] [facility] [place] [phone]

## 2019-12-06 NOTE — Progress Notes (Signed)
Clinical Opiate Withdrawal Scale 9 Dr Crissie Reese notified  Will recheck in 4 hours and call if > 8 on scale

## 2019-12-06 NOTE — Progress Notes (Signed)
Post Partum Day 2  Subjective:  Meredith Vega is a 29 y.o. [id number] [redacted]w[redacted]d s/p . No acute events overnight.  Pt denies problems with ambulating, voiding or po intake.  She denies nausea or vomiting.  Pain is moderately controlled. Patient continues to report feeling sweaty and feeling as if it has been a long time since she has had medication for her withdrawal symptoms from opioids.  She has had flatus. She has not had bowel movement.  Lochia Minimal.  Plan for birth control is nexplanon.  Method of Feeding: bottle  Objective: BP 109/74 (BP Location: Left Arm)   Pulse 75   Temp 97.7 F (36.5 C) (Oral)   Resp 18   LMP 02/25/2019 (Within Weeks)   SpO2 100%   Breastfeeding Unknown   Physical Exam:  General: alert, cooperative and no distress Lochia:normal flow Chest: CTAB Heart: RRR no m/r/g Abdomen: +BS, soft, nontender, fundus firm at/below umbilicus Uterine Fundus: firm DVT Evaluation: No evidence of DVT seen on physical exam. Extremities: no LE edema  Recent Labs    12/05/19 0347  HGB 9.3*  HCT 27.3*    Assessment/Plan:  ASSESSMENT: Meredith Vega is a 29 y.o. G2P1002 [redacted]w[redacted]d ppd #2 s/p NSVD doing well.   Will keep pt here until 6/13 and treat with methadone Dc 6/13 F/u methadone clinic  PP f/u at health dept     LOS: 2 days   Meredith Vega 12/06/2019, 8:42 AM

## 2019-12-06 NOTE — Progress Notes (Addendum)
Mom back in room from getting "air". Hortencia Pilar SW and Maudie Flakes RN discussed with mom about what she was doing when getting "air". Mom states she is smoking cigarettes. Denies any use of illegal substances while she is outside.  Safe Sleep discussed again with mom due to Mom sleeping with infant in arms. Baby placed on back in crib.

## 2019-12-07 LAB — HEPATITIS B SURFACE ANTIBODY, QUANTITATIVE: Hep B S AB Quant (Post): 69.2 m[IU]/mL (ref >9.9)

## 2019-12-07 MED ORDER — OXYCODONE HCL 5 MG PO TABS
5.0000 mg | ORAL_TABLET | ORAL | Status: DC | PRN
  Administered 2019-12-07 – 2019-12-08 (×3): 5 mg via ORAL
  Filled 2019-12-07 (×3): qty 1

## 2019-12-07 MED ORDER — HEPATITIS A VACCINE 1440 EL U/ML IM SUSP
1.0000 mL | Freq: Once | INTRAMUSCULAR | Status: AC
  Administered 2019-12-08: 1440 [IU] via INTRAMUSCULAR
  Filled 2019-12-07: qty 1

## 2019-12-07 NOTE — Progress Notes (Signed)
The patient had asked for pain medicine and was going to also take the TdaP vaccine.  After RN prepared for this and returned to the room, the patient stated her father had just arrived and she had to go downstairs to visit with him.  She was gone almost a full hour, at which time she let the RN know she had returned.  No other unusual incidence noted during this shift.  Delice Bison [place] [employer] fundraiser

## 2019-12-07 NOTE — Progress Notes (Signed)
POSTPARTUM PROGRESS NOTE  Post Partum Day 3  Subjective:  Meredith Vega is a 29 y.o. G2P1002 s/p NSVD at [redacted]w[redacted]d.  She reports she is doing well. No acute events overnight. She denies any problems with ambulating, voiding or po intake. Denies nausea or vomiting.  Pain is moderately controlled.  Lochia is light.  Objective: Blood pressure 113/69, pulse 66, temperature 98.2 F (36.8 C), temperature source Oral, resp. rate 14, last menstrual period 02/25/2019, SpO2 100 %, unknown if currently breastfeeding.  Physical Exam:  General: alert, cooperative and no distress Chest: no respiratory distress Heart:regular rate, distal pulses intact Abdomen: soft, nontender,  Uterine Fundus: firm, appropriately tender DVT Evaluation: No calf swelling or tenderness Extremities: no LE edema Skin: warm, dry  Recent Labs    12/05/19 0347  HGB 9.3*  HCT 27.3*    Assessment/Plan: Meredith Vega is a 29 y.o. G2P1002 s/p NSVD at [redacted]w[redacted]d at [facility] ED.  PPD#3 - Doing well  Routine postpartum care  Opioid use disorder: on methadone 10mg  daily, reports her withdrawal symptoms are under control, cont current dose. Hepatitis serologies drawn given at increased risk for infection with OUD, immune to Hep B but not to Hep A, offered vaccination which she accepts. ECG ordered yesterday unremarkable w normal QTc 415  Urine GN/CT not yet collected either, discussed w RN to collect at next opportunity..   Anemia: PO iron every other day  Contraception: Nexplanon, already placed  Feeding: Bottle  Dispo: Plan for discharge 12/09/2019 directly to methadone clinic. Per SW note there are barriers to discharge for infant.    LOS: 3 days   12/11/2019, MD/[facility] OB Fellow  12/07/2019, 11:29 AM

## 2019-12-07 NOTE — Progress Notes (Signed)
Clinical Opiate Withdrawal Scale scores have been 4-6 throughout the night. Per pt, she went outside only once tonight. Day shift RN updated.  Patrica Duel, RN  12/07/2019 7:47 AM

## 2019-12-07 NOTE — Progress Notes (Signed)
CSW received consult due to score 10 on Edinburgh Depression Screen.  [name] was assessed, offered education, and resources during CSW full assessment on 12/05/19, however, today,  this CSW visited [name] at bedside to assess current BH needs and/or concerns. [name] denied any concerns or changes in mood, and stated she is doing well. [name] requested CSW notate [name] now has car seat and clothes for infant, CSW agreed. [name] and infant were laying in bed together however, [name] was awake, sitting up, playing with infant's fingers, kissing infant's face, and appearing alert. CSW reminded [name] to place infant back in bassinet once finished bonding.    CSW will remain in supportive role. There remain barriers to infant's discharge.   Meredith Vega, MSW, LCSWA Clinical Social Worker [phone] 

## 2019-12-08 NOTE — Progress Notes (Signed)
This RN has noticed that pt has gone outside "to get fresh air"  twice thus far during this shift.  The patient also went outside around 1300 "to get baby clothes from my friend."

## 2019-12-08 NOTE — Progress Notes (Signed)
POSTPARTUM PROGRESS NOTE  Post Partum Day 4  Subjective:  KARISHA MARLIN is a 29 y.o. G2P1002 s/p NSVD at [redacted]w[redacted]d. Patient reports feeling well. She is tolerating PO. Ambulating and urinating without difficulty. Lochia minimal. Methadone is controlling opioid withdrawal symptoms well.   Objective: Blood pressure 118/88, pulse 86, temperature 98.1 F (36.7 C), temperature source Oral, resp. rate 18, last menstrual period 02/25/2019, SpO2 100 %, unknown if currently breastfeeding.  Physical Exam:  General: alert, cooperative and no distress Chest: no respiratory distress Heart:regular rate Abdomen: soft, nontender,  Uterine Fundus: firm, appropriately tender DVT Evaluation: No calf swelling or tenderness Extremities: no LE edema Skin: warm, dry  No results for input(s): HGB, HCT in the last 72 hours.  Assessment/Plan: LANAH STEINES is a 28 y.o. G2P1002 s/p NSVD at [redacted]w[redacted]d at [facility] ED.  PPD#4 - Doing well  Routine postpartum care  Opioid use disorder: on methadone 10mg  daily, reports her withdrawal symptoms are under control, cont current dose. Hepatitis serologies drawn given at increased risk for infection with OUD, immune to Hep B but not to Hep A, offered vaccination which she accepts (not yet given). Urine GC/Chlamydia collected. Discussed with patient discharge today vs tomorrow and patient would like to discharge tomorrow. If she changes her mind, please page for DC orders.   Anemia: PO iron every other day  Contraception: Nexplanon, already placed  Feeding: Bottle  Dispo: Plan for discharge 12/09/2019 directly to methadone clinic. Per SW note there are barriers to discharge for infant.    LOS: 4 days   12/11/2019, MD [facility] Family Medicine Fellow, [facility] for [facility], [facility] 12/08/2019, 5:41 AM

## 2019-12-08 NOTE — Progress Notes (Addendum)
Mother went outside at 2350 to "Get fresh air" while RN assessed infant. Support person in room with infant when RN left. RN noticed infant had trimers, consistent sucking while doing assessment. Meredith Vega

## 2019-12-08 NOTE — Progress Notes (Signed)
RN noticed patient walking down hallway to go outside at 0542. Support person in room with infant. Elam Dutch

## 2019-12-08 NOTE — Progress Notes (Signed)
This RN noticed pt leaving the unit again to go outside. Support person in room with infant.

## 2019-12-08 NOTE — Progress Notes (Signed)
Pt walked off unit and returned to her room at 2128. FOB in room with baby.

## 2019-12-08 NOTE — Progress Notes (Signed)
Mom went out for "some fresh air".

## 2019-12-09 LAB — SURGICAL PATHOLOGY

## 2019-12-09 LAB — GC/CHLAMYDIA PROBE AMP (~~LOC~~) NOT AT ARMC
Chlamydia: NEGATIVE
Comment: NEGATIVE
Comment: NORMAL
Neisseria Gonorrhea: NEGATIVE

## 2019-12-09 MED ORDER — NICOTINE POLACRILEX 2 MG MT GUM: 2.0000 mg | CHEWING_GUM | OROMUCOSAL | 0 refills | Status: DC | PRN

## 2019-12-09 MED ORDER — ACETAMINOPHEN 325 MG PO TABS: 650.0000 mg | ORAL_TABLET | Freq: Four times a day (QID) | ORAL | 0 refills | Status: AC | PRN

## 2019-12-09 MED ORDER — FERROUS SULFATE 325 (65 FE) MG PO TABS: 325.0000 mg | ORAL_TABLET | ORAL | 0 refills | Status: DC

## 2019-12-09 MED ORDER — IBUPROFEN 600 MG PO TABS: 600.0000 mg | ORAL_TABLET | Freq: Four times a day (QID) | ORAL | 0 refills | Status: DC

## 2019-12-09 NOTE — Progress Notes (Signed)
Pt off unit from [id number].

## 2019-12-09 NOTE — Progress Notes (Signed)
Pt off the unit from 2310-2325; support person in room with baby. Baby with low temp and had to go to the nursery for the heat shield and pt off the unit from 0025-0039.

## 2019-12-09 NOTE — Progress Notes (Addendum)
8:21am-CSW receive call from Meredith Vega informing CSW that she is planning to  staff case with supervisor and follow back up with CSW. CSW also updated Meredith Vega that infants CDS is back and is positive for sustances. CSW asked to send information over to [facility]. Vega at this time.   At this time barriers still remain to infant discharging home to [facility].    CSW updated RN on today of [name]'s ability to arrive at [facility] between the hours of 7:30am-10:30am. [name] and RN both aware of [name]:s need to be there for further services.   CSW will follow up with Meredith Vega with [facility] CPS to determine further updates for infant.     Meredith Vega, MSW, LCSW [facility] [place] [phone]

## 2022-06-21 ENCOUNTER — Other Ambulatory Visit: Payer: Self-pay

## 2022-06-21 ENCOUNTER — Emergency Department (HOSPITAL_COMMUNITY)
Admission: EM | Admit: 2022-06-21 | Discharge: 2022-06-23 | Disposition: A | Discharge status: Home / Self Care | Payer: Medicaid Other | Attending: Emergency Medicine | Admitting: Emergency Medicine

## 2022-06-21 ENCOUNTER — Encounter (HOSPITAL_COMMUNITY): Payer: Self-pay | Admitting: Emergency Medicine

## 2022-06-21 DIAGNOSIS — H5712 Ocular pain, left eye: Secondary | ICD-10-CM | POA: Insufficient documentation

## 2022-06-21 DIAGNOSIS — R2990 Unspecified symptoms and signs involving the nervous system: Secondary | ICD-10-CM | POA: Insufficient documentation

## 2022-06-21 DIAGNOSIS — A523 Neurosyphilis, unspecified: Secondary | ICD-10-CM | POA: Diagnosis not present

## 2022-06-21 DIAGNOSIS — H547 Unspecified visual loss: Secondary | ICD-10-CM | POA: Diagnosis present

## 2022-06-21 DIAGNOSIS — F17210 Nicotine dependence, cigarettes, uncomplicated: Secondary | ICD-10-CM | POA: Diagnosis not present

## 2022-06-21 DIAGNOSIS — J45909 Unspecified asthma, uncomplicated: Secondary | ICD-10-CM | POA: Insufficient documentation

## 2022-06-21 NOTE — ED Triage Notes (Signed)
Pt reports loss of vision in her left eye and half vision loss in her right eye x 1 week. Pt reports dizziness as well.

## 2022-06-22 ENCOUNTER — Emergency Department (HOSPITAL_COMMUNITY): Payer: Medicaid Other

## 2022-06-22 ENCOUNTER — Encounter (HOSPITAL_COMMUNITY): Payer: Self-pay

## 2022-06-22 LAB — URINALYSIS, ROUTINE W REFLEX MICROSCOPIC
Glucose, UA: NEGATIVE mg/dL
Ketones, ur: NEGATIVE mg/dL
Leukocytes,Ua: NEGATIVE
Nitrite: NEGATIVE
Protein, ur: 30 mg/dL — AB
Specific Gravity, Urine: 1.02 (ref 1.005–1.030)
pH: 6 (ref 5.0–8.0)

## 2022-06-22 LAB — CSF CELL COUNT WITH DIFFERENTIAL
RBC Count, CSF: 185 /mm3 — ABNORMAL HIGH
RBC Count, CSF: 2 /mm3 — ABNORMAL HIGH
Tube #: 1
Tube #: 4
WBC, CSF: 0 /mm3 (ref 0–5)
WBC, CSF: 1 /mm3 (ref 0–5)

## 2022-06-22 LAB — RAPID URINE DRUG SCREEN, HOSP PERFORMED
Amphetamines: NOT DETECTED
Barbiturates: NOT DETECTED
Benzodiazepines: NOT DETECTED
Cocaine: POSITIVE — AB
Opiates: NOT DETECTED
Tetrahydrocannabinol: POSITIVE — AB

## 2022-06-22 LAB — CBC WITH DIFFERENTIAL/PLATELET
Abs Immature Granulocytes: 0.02 10*3/uL (ref 0.00–0.07)
Basophils Absolute: 0 10*3/uL (ref 0.0–0.1)
Basophils Relative: 0 %
Eosinophils Absolute: 0.2 10*3/uL (ref 0.0–0.5)
Eosinophils Relative: 2 %
HCT: 39.7 % (ref 36.0–46.0)
Hemoglobin: 12.6 g/dL (ref 12.0–15.0)
Immature Granulocytes: 0 %
Lymphocytes Relative: 26 %
Lymphs Abs: 2.1 10*3/uL (ref 0.7–4.0)
MCH: 26.8 pg (ref 26.0–34.0)
MCHC: 31.7 g/dL (ref 30.0–36.0)
MCV: 84.5 fL (ref 80.0–100.0)
Monocytes Absolute: 0.6 10*3/uL (ref 0.1–1.0)
Monocytes Relative: 8 %
Neutro Abs: 5.2 10*3/uL (ref 1.7–7.7)
Neutrophils Relative %: 64 %
Platelets: 474 10*3/uL — ABNORMAL HIGH (ref 150–400)
RBC: 4.7 MIL/uL (ref 3.87–5.11)
RDW: 15.7 % — ABNORMAL HIGH (ref 11.5–15.5)
WBC: 8.1 10*3/uL (ref 4.0–10.5)
nRBC: 0 % (ref 0.0–0.2)

## 2022-06-22 LAB — BASIC METABOLIC PANEL
Anion gap: 9 (ref 5–15)
BUN: 15 mg/dL (ref 6–20)
CO2: 26 mmol/L (ref 22–32)
Calcium: 9.4 mg/dL (ref 8.9–10.3)
Chloride: 102 mmol/L (ref 98–111)
Creatinine, Ser: 0.85 mg/dL (ref 0.44–1.00)
GFR, Estimated: >60 mL/min (ref >60)
Glucose, Bld: 94 mg/dL (ref 70–99)
Potassium: 3.4 mmol/L — ABNORMAL LOW (ref 3.5–5.1)
Sodium: 137 mmol/L (ref 135–145)

## 2022-06-22 LAB — PROTEIN, CSF: Total  Protein, CSF: 37 mg/dL (ref 15–45)

## 2022-06-22 LAB — LACTIC ACID, PLASMA: Lactic Acid, Venous: 1.1 mmol/L (ref 0.5–1.9)

## 2022-06-22 LAB — RPR
RPR Ser Ql: REACTIVE — AB
RPR Titer: 1:128 {titer}

## 2022-06-22 LAB — GLUCOSE, CSF: Glucose, CSF: 55 mg/dL (ref 40–70)

## 2022-06-22 LAB — HIV ANTIBODY (ROUTINE TESTING W REFLEX): HIV Screen 4th Generation wRfx: NONREACTIVE

## 2022-06-22 LAB — PREGNANCY, URINE: Preg Test, Ur: NEGATIVE

## 2022-06-22 MED ORDER — SODIUM CHLORIDE (PF) 0.9 % IJ SOLN
INTRAMUSCULAR | Status: AC
  Filled 2022-06-22: qty 50

## 2022-06-22 MED ORDER — ATROPINE SULFATE 1 % OP SOLN
1.0000 [drp] | Freq: Two times a day (BID) | OPHTHALMIC | Status: DC
  Administered 2022-06-22 (×2): 1 [drp] via OPHTHALMIC
  Filled 2022-06-22: qty 2

## 2022-06-22 MED ORDER — MORPHINE SULFATE (PF) 4 MG/ML IV SOLN
4.0000 mg | INTRAVENOUS | Status: AC
  Administered 2022-06-22: 4 mg via INTRAVENOUS
  Filled 2022-06-22: qty 1

## 2022-06-22 MED ORDER — FLUORESCEIN SODIUM 1 MG OP STRP
1.0000 | ORAL_STRIP | Freq: Once | OPHTHALMIC | Status: AC
  Administered 2022-06-22: 1 via OPHTHALMIC
  Filled 2022-06-22: qty 1

## 2022-06-22 MED ORDER — ATROPINE SULFATE 1 % OP SOLN
Freq: Two times a day (BID) | OPHTHALMIC | Status: DC
  Filled 2022-06-22: qty 2

## 2022-06-22 MED ORDER — ACETAMINOPHEN 500 MG PO TABS
1000.0000 mg | ORAL_TABLET | ORAL | Status: AC
  Administered 2022-06-22: 1000 mg via ORAL
  Filled 2022-06-22: qty 2

## 2022-06-22 MED ORDER — ONDANSETRON HCL 4 MG/2ML IJ SOLN
4.0000 mg | Freq: Once | INTRAMUSCULAR | Status: AC
  Administered 2022-06-22: 4 mg via INTRAVENOUS
  Filled 2022-06-22: qty 2

## 2022-06-22 MED ORDER — MIDAZOLAM HCL 2 MG/2ML IJ SOLN
2.0000 mg | INTRAMUSCULAR | Status: AC
  Administered 2022-06-22: 2 mg via INTRAVENOUS
  Filled 2022-06-22: qty 2

## 2022-06-22 MED ORDER — PENICILLIN G POTASSIUM 20000000 UNITS IJ SOLR
12.0000 10*6.[IU] | Freq: Two times a day (BID) | INTRAVENOUS | Status: DC
  Administered 2022-06-22: 12 10*6.[IU] via INTRAVENOUS
  Filled 2022-06-22 (×2): qty 12

## 2022-06-22 MED ORDER — TETRACAINE HCL 0.5 % OP SOLN
2.0000 [drp] | Freq: Once | OPHTHALMIC | Status: AC
  Administered 2022-06-22: 2 [drp] via OPHTHALMIC
  Filled 2022-06-22: qty 4

## 2022-06-22 MED ORDER — PREDNISOLONE ACETATE 1 % OP SUSP
1.0000 [drp] | OPHTHALMIC | Status: DC
  Administered 2022-06-22 – 2022-06-23 (×5): 1 [drp] via OPHTHALMIC
  Filled 2022-06-22: qty 5

## 2022-06-22 MED ORDER — LIDOCAINE-EPINEPHRINE (PF) 2 %-1:200000 IJ SOLN
10.0000 mL | Freq: Once | INTRAMUSCULAR | Status: AC
  Administered 2022-06-22: 10 mL via INTRADERMAL
  Filled 2022-06-22: qty 20

## 2022-06-22 MED ORDER — IOHEXOL 300 MG/ML  SOLN
75.0000 mL | Freq: Once | INTRAMUSCULAR | Status: AC | PRN
  Administered 2022-06-22: 75 mL via INTRAVENOUS

## 2022-06-22 MED ORDER — GADOBUTROL 1 MMOL/ML IV SOLN
5.0000 mL | Freq: Once | INTRAVENOUS | Status: AC | PRN
  Administered 2022-06-22: 5 mL via INTRAVENOUS

## 2022-06-22 MED ORDER — LIP MEDEX EX OINT
TOPICAL_OINTMENT | Freq: Once | CUTANEOUS | Status: AC
  Administered 2022-06-22: 75 via TOPICAL
  Filled 2022-06-22: qty 7

## 2022-06-22 NOTE — ED Provider Notes (Signed)
Physical Exam  BP 93/65   Pulse 65   Temp 97.7 F (36.5 C) (Oral)   Resp 18   SpO2 98%   Physical Exam Vitals and nursing note reviewed.  Constitutional:      General: She is not in acute distress.    Appearance: She is well-developed.     Comments: Unkempt appearing  HENT:     Head: Normocephalic and atraumatic.     Right Ear: External ear normal.     Left Ear: External ear normal.     Nose: Nose normal.     Mouth/Throat:     Comments: Right upper lip swelling.  Patient states this has been present for several weeks Eyes:     Extraocular Movements: Extraocular movements intact.     Conjunctiva/sclera: Conjunctivae normal.     Pupils: Pupils are equal, round, and reactive to light.  Pulmonary:     Effort: Pulmonary effort is normal.  Musculoskeletal:     Cervical back: Normal range of motion and neck supple.     Right lower leg: No edema.     Left lower leg: No edema.  Skin:    General: Skin is warm and dry.  Neurological:     Mental Status: She is alert and oriented to person, place, and time.     Comments: Cranial nerves II through XII grossly intact with exception of visual fields and pupils which are sluggishly reactive bilaterally.  Decreased sensation to light touch in right arm and right leg.  4/5 strength in right lower extremity and right upper extremity.  Psychiatric:        Mood and Affect: Mood normal.     Procedures  .Lumbar Puncture  Date/Time: 06/22/2022 5:37 PM  Performed by: Rondel Baton, MD Authorized by: Rondel Baton, MD   Consent:    Consent obtained:  Verbal   Consent given by:  Patient   Risks discussed:  Bleeding, pain, infection, headache and nerve damage   Alternatives discussed:  No treatment Universal protocol:    Patient identity confirmed:  Verbally with patient Pre-procedure details:    Procedure purpose:  Diagnostic   Preparation: Patient was prepped and draped in usual sterile fashion   Anesthesia:    Anesthesia  method:  Local infiltration   Local anesthetic:  Lidocaine 2% WITH epi Procedure details:    Lumbar space:  L4-L5 interspace   Patient position:  L lateral decubitus   Needle gauge:  20   Needle length (in):  3.5   Number of attempts:  1   Fluid appearance:  Clear   Tubes of fluid:  4   Total volume (ml):  4.5 Post-procedure details:    Puncture site:  Adhesive bandage applied and direct pressure applied   Procedure completion:  Tolerated well, no immediate complications   ED Course / MDM   Clinical Course as of 06/22/22 1737  Wed Jun 22, 2022  0714 Assumed care from Dr Pilar Plate. 31 yo F with hx of substance abuse who presents with painless vision loss, photosensitivity bilaterally. L eye total vision loss and R eye with counting fingers. Fluorescein stain normal. Also having R sided neuro (face, arm, leg) sensation deficits. Decreased strength of RLE as well. However, this was not present on initial provider evaluation. Will attempt to have the patient seen by ophtho today since she may be lost to follow-up. Awaiting MRI brain and orbits. Ophtho consulted and awaiting response.  On repeat exam still has right lower  extremity weakness that she says has been present for 2 weeks also with slight right upper extremity weakness.  Says that right-sided facial sensation is intact to light touch but right upper and right lower extremity both have decreased sensation to light touch. [RP]  0721 RPR(!): Reactive Newly positive.  With her symptoms could be representative of neurosyphilis.  Will consult neurology. [RP]  0900 MR BRAIN W WO CONTRAST IMPRESSION: 1. No evidence of acute intracranial or intraorbital abnormality. 2. Mild-to-moderate paranasal sinus mucosal thickening and moderate mastoid effusions. [RP]  0907 Dr Wilford Corner feels that it is less likely neuro syphilis. Feels that MRI would show findings. Also recommends MRI L spine wwo contrast. Does not feel that LP is absolutely necessary and would  talk to ID about this.  [RP]  [phone] Spoke with Dr Elinor Parkinson from ID who feels that patient should be given IV PCN and have LP sent for VDRL, glucose, protein, culture and serum FTA-ABS.  Due to concerns for possible neurosyphilis.  Will perform lumbar puncture when MRI of the spine is completed to ensure there is no epidural abscess. [RP]  [phone] Dr Cathey Endow from ophtho has been consulted.  Since patient likely will not be discharged today he will come evaluate in the emergency department.  Feels that pt should potentially be transferred to [facility] for retina specialist if her symptoms are found to be from neuro syphilis.  [RP]  [phone] Dr Yetta Barre from radiology contacted regarding patients repeat contrast load should be safe especially with newer contrast agents.  [RP]  1059 Dr. Cathey Endow has evaluated the patient.  She has bilateral panuveitis.  They are unable to see the retina.  They recommend starting Pred forte and atropine eyedrops.  Given the extent of her uveitis and the fact that retinal disease cannot be excluded they feel that she would benefit from being transferred to a facility that has a retina specialist such as [facility]. [RP]  1556 Dr Mertha Finders from [facility] was consulted.  Agrees that patient likely should see a retinal specialist. Will see pt if they are admitted.  Will discuss with physician access line if patient can be transferred to [facility]. [RP]  1709 LP performed.  [RP]  1710 Signed out to Dr Anitra Lauth. Awaiting final dispo and LP results. [RP]  1717 Kristen from [facility] access line states that they do not have bed availability at this time.  They are not able to accept the patient to the emergency departments.  Unable to place on wait list.  There is suggest calling back tomorrow. [RP]    Clinical Course User Index [RP] Rondel Baton, MD   Medical Decision Making Amount and/or Complexity of Data Reviewed Labs: ordered. Decision-making details documented in ED  Course. Radiology: ordered. Decision-making details documented in ED Course.  Risk OTC drugs. Prescription drug management. Decision regarding hospitalization.      Rondel Baton, MD 06/22/22 1740

## 2022-06-22 NOTE — ED Notes (Signed)
Carrolyn Leigh with [facility] [address] [place] [address] ED to ED number to report [phone] Accepting MD Selmer [facility]  [facility]  [phone]

## 2022-06-22 NOTE — ED Provider Notes (Signed)
Patient was not accepted by [facility] due to capacity issues.  Spoke with [facility] who is willing to accept the patient.  She will be transferred there for further specialist care.   Gwyneth Sprout, MD 06/22/22 [phone]

## 2022-06-22 NOTE — ED Notes (Signed)
Called [facility] for transport - states they will call back with an ETA

## 2022-06-22 NOTE — ED Provider Triage Note (Signed)
Emergency Medicine Provider Triage Evaluation Note  Meredith Vega , a 31 y.o. female  was evaluated in triage.  Pt complains of visual changes, states over the last week she has been having blurred vision initially started in her left eye and outs going to her right eye, states that she can only make out shapes out of her left eye states is her entire vision, however right eye she can only make out shapes but its only half of her eye, no eye injuries, wearing glasses, she denies any headaches paresthesias or weakness the upper or lower extremities, states she has had some fevers and chills, she is having no neck pain no back pain and she denies any history of IV drug use she, she is never had this in the past.  Review of Systems  Positive: Visual changes, red eye Negative: Headaches paresthesias  Physical Exam  BP (!) 132/95 (BP Location: Right Arm)   Pulse 88   Temp 98.1 F (36.7 C) (Oral)   Resp 18   SpO2 100%  Gen:   Awake, no distress   Resp:  Normal effort  MSK:   Moves extremities without difficulty  Other:  She has visual field abnormalities, left eyes she cannot seem on the temporal aspect, she can make out shapes in front of her, out of her right eye she has temporal as well as frontal vision intact but it is blurred, remaining cranial nerves are intact.  No difficulty with word finding, no regular weakness, gait intact.  Medical Decision Making  Medically screening exam initiated at 12:13 AM.  Appropriate orders placed.  Meredith Vega was informed that the remainder of the evaluation will be completed by another provider, this initial triage assessment does not replace that evaluation, and the importance of remaining in the ED until their evaluation is complete.  Lab work has been ordered will need further workup.   Carroll Sage, PA-C 06/22/22 [phone]

## 2022-06-22 NOTE — ED Provider Notes (Addendum)
[facility]PT [facility] Emergency Department Provider Note MRN:  [id number]  Arrival date & time: 06/22/22     Chief Complaint   Loss of Vision and Eye Pain   History of Present Illness   Meredith Vega is a 31 y.o. year-old female with a history of substance use disorder presenting to the ED with chief complaint of vision loss.  Patient can really see out of her left eye, started losing her vision about a week ago.  Now she is losing vision in her right eye as well, everything is very blurry in the right eye.  She sees things in her medial vision better than her lateral vision in the right eye.  Denies eye trauma, denies fever, uses occasional heroin but is adamant that she does not use it intravenously.  Over the past few days she has also noticed decreased sensation to the right side of her body.  She also feels like her right leg is weak.   Review of Systems  A thorough review of systems was obtained and all systems are negative except as noted in the HPI and PMH.   Patient's Health History    Past Medical History:  Diagnosis Date   Asthma    regular inhaler use   Polysubstance abuse (HCC) 2021    Past Surgical History:  Procedure Laterality Date   WISDOM TOOTH EXTRACTION      Family History  Problem Relation Age of Onset   Cirrhosis Mother     Social History   Socioeconomic History   Marital status: Single    Spouse name: Not on file   Number of children: Not on file   Years of education: Not on file   Highest education level: Not on file  Occupational History   Not on file  Tobacco Use   Smoking status: Some Days    Packs/day: 1.00    Types: Cigarettes   Smokeless tobacco: Never   Tobacco comments:    cutting back  Vaping Use   Vaping Use: Never used  Substance and Sexual Activity   Alcohol use: No   Drug use: Yes    Types: Marijuana, Heroin, Cocaine    Comment: last cocaine use in september;  heroin use 10/28   Sexual activity: Yes     Birth control/protection: None  Other Topics Concern   Not on file  Social History Narrative   Pt reports that she and her partner have recently bought a car, but prior to that they had difficulty with transportation.  Getting a car has helped significantly with this challenge   Social Determinants of Health   Financial Resource Strain: Low Risk  (04/24/2018)   Overall Financial Resource Strain (CARDIA)    Difficulty of Paying Living Expenses: Not very hard  Food Insecurity: Not on file  Transportation Needs: Unmet Transportation Needs (04/24/2018)   PRAPARE - Administrator, Civil Service (Medical): Yes    Lack of Transportation (Non-Medical): Yes  Physical Activity: Not on file  Stress: Not on file  Social Connections: Not on file  Intimate Partner Violence: Not At Risk (04/24/2018)   Humiliation, Afraid, Rape, and Kick questionnaire    Fear of Current or Ex-Partner: No    Emotionally Abused: No    Physically Abused: No    Sexually Abused: No     Physical Exam   Vitals:   06/22/22 0615 06/22/22 0615  BP: (!) 106/90   Pulse: 86   Resp: 16  Temp:  (!) 97.5 F (36.4 C)  SpO2: (!) 88%     CONSTITUTIONAL: Chronically ill-appearing, NAD NEURO/PSYCH:  Alert and oriented x 3, no focal deficits EYES:  eyes equal and reactive ENT/NECK:  no LAD, no JVD CARDIO: Regular rate, well-perfused, normal S1 and S2 PULM:  CTAB no wheezing or rhonchi GI/GU:  non-distended, non-tender MSK/SPINE:  No gross deformities, no edema SKIN:  no rash, atraumatic   *Additional and/or pertinent findings included in MDM below  Diagnostic and Interventional Summary    EKG Interpretation  Date/Time:    Ventricular Rate:    PR Interval:    QRS Duration:   QT Interval:    QTC Calculation:   R Axis:     Text Interpretation:         Labs Reviewed  RPR - Abnormal; Notable for the following components:      Result Value   RPR Ser Ql Reactive (*)    All other components  within normal limits  BASIC METABOLIC PANEL - Abnormal; Notable for the following components:   Potassium 3.4 (*)    All other components within normal limits  CBC WITH DIFFERENTIAL/PLATELET - Abnormal; Notable for the following components:   RDW 15.7 (*)    Platelets 474 (*)    All other components within normal limits  CULTURE, BLOOD (ROUTINE X 2)  CULTURE, BLOOD (ROUTINE X 2)  LACTIC ACID, PLASMA  URINALYSIS, ROUTINE W REFLEX MICROSCOPIC  HIV ANTIBODY (ROUTINE TESTING W REFLEX)  RAPID URINE DRUG SCREEN, HOSP PERFORMED  PREGNANCY, URINE  LACTIC ACID, PLASMA  T.PALLIDUM AB, TOTAL    CT Head Wo Contrast  Final Result    CT Orbits W Contrast  Final Result    MR BRAIN W WO CONTRAST    (Results Pending)  MR ORBITS W WO CONTRAST    (Results Pending)    Medications  tetracaine (PONTOCAINE) 0.5 % ophthalmic solution 2 drop (has no administration in time range)  iohexol (OMNIPAQUE) 300 MG/ML solution 75 mL (75 mLs Intravenous Contrast Given 06/22/22 0317)  sodium chloride (PF) 0.9 % injection (  Given by Other 06/22/22 0713)  fluorescein ophthalmic strip 1 strip (1 strip Both Eyes Given by Other 06/22/22 0730)     Procedures  /  Critical Care Procedures  ED Course and Medical Decision Making  Initial Impression and Ddx Differential diagnosis includes brain mass, endophthalmitis, optic neuritis.  She has conjunctival erythema bilaterally, she cannot see movement or light with the left eye.  She can make out correct number of fingers with the right eye with squinting.  She is having trouble raising the right leg to gravity.  With history of IV drug use, brain abscess also considered.  Will obtain MRI imaging as CT scans obtained in triage are without explanation.  Suspect will need ophthalmology consultation as well.  Patient has a lot of light sensitivity on exam to bilateral eyes, with fluorescein staining under Woods lamp there is no significant uptake.  Extraocular movements seem  intact, pupils seem reactive.  Question keratitis, conjunctivitis, postseptal cellulitis though there is not significant swelling or proptosis.  Past medical/surgical history that increases complexity of ED encounter: History of substance use disorder  Interpretation of Diagnostics I personally reviewed the laboratory assessment and my interpretation is as follows: No significant blood count or electrolyte disturbance  CT imaging showing paranasal sinus disease no findings to explain patient's symptoms.  Patient Reassessment and Ultimate Disposition/Management Clinical Course as of 06/22/22 0731  Wed Dec  60, [place] care from Dr Sedonia Small. 31 yo F with hx of substance abuse who presents with painless vision loss, photosensitivity bilaterally. L eye total vision loss and R eye with counting fingers. Fluorescein stain normal. Also having R sided neuro (face, arm, leg) sensation deficits. Decreased strength of RLE as well. However, this was not present on initial provider evaluation. Will attempt to have the patient seen by ophtho today since she may be lost to follow-up. Awaiting MRI brain and orbits. Ophtho consulted and awaiting response.  [RP]  [id number] RPR(!): Reactive Newly positive.  With her symptoms could be representative of neurosyphilis. [RP]    Clinical Course User Index [RP] Fransico Meadow, MD     Given the combination of vision loss and right-sided neurological deficits, patient will need MRI imaging to exclude emergent process.  Patient would benefit from ophthalmology evaluation as well.  Signed out to oncoming provider at shift change.  Patient management required discussion with the following services or consulting groups:  None  Complexity of Problems Addressed Acute illness or injury that poses threat of life of bodily function  Additional Data Reviewed and Analyzed Further history obtained from: None  Additional Factors Impacting ED Encounter  Risk None  Barth Kirks. Sedonia Small, Britt [email or web address]  Final Clinical Impressions(s) / ED Diagnoses     ICD-10-CM   1. Vision loss  H54.7     2. Neurological symptoms  R29.90       ED Discharge Orders     None        Discharge Instructions Discussed with and Provided to Patient:   Discharge Instructions   None      Maudie Flakes, MD 06/22/22 0710    Maudie Flakes, MD 06/22/22 [phone]

## 2022-06-22 NOTE — Consult Note (Signed)
OPHTHALMOLOGY CONSULT NOTE  Date: 06/22/22 Time: 10:25 AM  Patient Name: Meredith Vega  DOB: 1990/07/21 MRN: [id number]  Reason for Consult: Vision loss  HPI:  This is a 31 y.o. female in the ER for evaluation of vision loss. The patient was found to be RPR positive and ID recommends IV antibiotics.    Prior to Admission medications   Medication Sig Start Date End Date Taking? Authorizing Provider  acetaminophen (TYLENOL) 325 MG tablet Take 2 tablets (650 mg total) by mouth every 6 (six) hours as needed (for pain scale < 4). 12/09/19   Fair, Hoyle Sauer, MD  albuterol (PROVENTIL HFA;VENTOLIN HFA) 108 (90 Base) MCG/ACT inhaler Inhale 1-2 puffs into the lungs every 6 (six) hours as needed for wheezing or shortness of breath. 03/18/18   Palumbo, April, MD  ferrous sulfate 325 (65 FE) MG tablet Take 1 tablet (325 mg total) by mouth every other day. 12/09/19   Fair, Hoyle Sauer, MD  ibuprofen (ADVIL) 600 MG tablet Take 1 tablet (600 mg total) by mouth every 6 (six) hours. 12/09/19   Fair, Hoyle Sauer, MD  nicotine polacrilex (NICORETTE) 2 MG gum Take 1 each (2 mg total) by mouth as needed for smoking cessation. 12/09/19   Fair, Hoyle Sauer, MD  Prenatal Vit-Fe Fumarate-FA (PRENATAL MULTIVITAMIN) TABS tablet Take 1 tablet by mouth daily at 12 noon.    [provider]  triamcinolone cream (KENALOG) 0.1 % Apply 1 application topically 2 (two) times daily. 06/12/18   Tyson Alias, MD    Past Medical History:  Diagnosis Date   Asthma    regular inhaler use   Polysubstance abuse (HCC) 2021    family history includes Cirrhosis in her mother.  Social History   Occupational History   Not on file  Tobacco Use   Smoking status: Some Days    Packs/day: 1.00    Types: Cigarettes   Smokeless tobacco: Never   Tobacco comments:    cutting back  Vaping Use   Vaping Use: Never used  Substance and Sexual Activity   Alcohol use: No   Drug use: Yes    Types: Marijuana, Heroin, Cocaine     Comment: last cocaine use in september;  heroin use 10/28   Sexual activity: Yes    Birth control/protection: None    Allergies  Allergen Reactions   Ceclor [Cefaclor] Hives    ROS: Positive as above, otherwise negative.  EXAM:  Mental Status: A&O x 3 - Very hard to examine  Base Exam: Right Eye Left Eye  Visual Acuity (At near) 20/400 HM  IOP (Tonopen) 18 14  Pupillary Exam Minimally reactive, no obvious APD Minimally reactive, no obvious APD  Motility Full  Full   Confrontation VF Appears Full - but limited by patients exam and VA Appears Full    Anterior Segment Exam    Lids/Lashes WNL, multiple bilateral areas of skin break down WNL  Conjuctiva 1+ inj 1+ inj  Cornea 2+ edema with KP K edema with granulomatous KP  Anterior Chamber Deep cell difficult to visualize 3+ flare Deep, cell difficult to visualize, 3+ flare  Iris Minimal reaction with synechiae Minimal reaction with synechiae  Lens Clear clear  Vitreous Unable to visualize    Poster Segment Exam    Disc Now view or red reflex No view or red reflex  CD ratio Synechiae found to break after dilation.    Macula    Vessels    Periphery  Radiographic Studies Reviewed: MR BRAIN W WO CONTRAST  Result Date: 06/22/2022 CLINICAL DATA:  Neuro deficit, acute, stroke suspected; Vision loss, binocular EXAM: MRI HEAD AND ORBITS WITHOUT AND WITH CONTRAST TECHNIQUE: Multiplanar, multiecho pulse sequences of the brain and surrounding structures were obtained without and with intravenous contrast. Multiplanar, multiecho pulse sequences of the orbits and surrounding structures were obtained including fat saturation techniques, before and after intravenous contrast administration. CONTRAST:  72mL GADAVIST GADOBUTROL 1 MMOL/ML IV SOLN COMPARISON:  CT head and CT orbits from the same day. FINDINGS: Mildly motion limited studies. MRI HEAD FINDINGS Brain: No acute infarction, hemorrhage, hydrocephalus, extra-axial collection or mass  lesion. No pathologic enhancement. Vascular: Major arterial flow voids are maintained. Skull and upper cervical spine: Normal marrow signal. Other: Moderate bilateral mastoid effusions. MRI ORBITS FINDINGS Orbits: No traumatic or inflammatory finding. Globes, optic nerves, orbital fat, extraocular muscles, vascular structures, and lacrimal glands are normal. Visualized sinuses: Mild-to-moderate paranasal sinus mucosal thickening. Soft tissues: Negative. IMPRESSION: 1. No evidence of acute intracranial or intraorbital abnormality. 2. Mild-to-moderate paranasal sinus mucosal thickening and moderate mastoid effusions. Electronically Signed   By: Feliberto Harts M.D.   On: 06/22/2022 08:30   MR ORBITS W WO CONTRAST  Result Date: 06/22/2022 CLINICAL DATA:  Neuro deficit, acute, stroke suspected; Vision loss, binocular EXAM: MRI HEAD AND ORBITS WITHOUT AND WITH CONTRAST TECHNIQUE: Multiplanar, multiecho pulse sequences of the brain and surrounding structures were obtained without and with intravenous contrast. Multiplanar, multiecho pulse sequences of the orbits and surrounding structures were obtained including fat saturation techniques, before and after intravenous contrast administration. CONTRAST:  29mL GADAVIST GADOBUTROL 1 MMOL/ML IV SOLN COMPARISON:  CT head and CT orbits from the same day. FINDINGS: Mildly motion limited studies. MRI HEAD FINDINGS Brain: No acute infarction, hemorrhage, hydrocephalus, extra-axial collection or mass lesion. No pathologic enhancement. Vascular: Major arterial flow voids are maintained. Skull and upper cervical spine: Normal marrow signal. Other: Moderate bilateral mastoid effusions. MRI ORBITS FINDINGS Orbits: No traumatic or inflammatory finding. Globes, optic nerves, orbital fat, extraocular muscles, vascular structures, and lacrimal glands are normal. Visualized sinuses: Mild-to-moderate paranasal sinus mucosal thickening. Soft tissues: Negative. IMPRESSION: 1. No evidence  of acute intracranial or intraorbital abnormality. 2. Mild-to-moderate paranasal sinus mucosal thickening and moderate mastoid effusions. Electronically Signed   By: Feliberto Harts M.D.   On: 06/22/2022 08:30   CT Orbits W Contrast  Result Date: 06/22/2022 CLINICAL DATA:  Initial evaluation for monocular visual loss, left eye, partial visual loss in right eye for 1 week. EXAM: CT ORBITS WITH CONTRAST TECHNIQUE: Multidetector CT images was performed according to the standard protocol following intravenous contrast administration. RADIATION DOSE REDUCTION: This exam was performed according to the departmental dose-optimization program which includes automated exposure control, adjustment of the mA and/or kV according to patient size and/or use of iterative reconstruction technique. CONTRAST:  66mL OMNIPAQUE IOHEXOL 300 MG/ML  SOLN COMPARISON:  None Available. FINDINGS: Orbits: Globes and are symmetric in size with normal appearance and morphology. Optic nerves symmetric and within normal limits. No visible inflammatory changes about either optic nerve sheath complex to suggest acute optic neuritis. Intraconal and extraconal fat maintained. Extra-ocular muscles symmetric and within normal limits. Lacrimal glands normal. No abnormality about the orbital apices or cavernous sinus. Superior orbital veins symmetric and within normal limits. No visible sellar or suprasellar abnormality. Visible paranasal sinuses: Scattered mucoperiosteal thickening present about the sphenoid ethmoidal and maxillary sinuses. Moderate bilateral mastoid effusions noted. Soft tissues: Periorbital soft tissues demonstrate no acute  inflammatory changes or other abnormality. Remainder of the visualized soft tissues of the face are within normal limits. Osseous: No acute osseous finding.  Bony orbits intact. Limited intracranial: Unremarkable. IMPRESSION: 1. Normal CT of the orbits. No findings to explain patient's symptoms identified. 2.  Mild-to-moderate paranasal sinus disease, likely allergic/inflammatory in nature. 3. Moderate bilateral mastoid effusions. Of uncertain significance. Correlation with physical exam suggested. Electronically Signed   By: Rise Mu M.D.   On: 06/22/2022 03:49   CT Head Wo Contrast  Result Date: 06/22/2022 CLINICAL DATA:  Initial evaluation for monocular visual loss, left side. EXAM: CT HEAD WITHOUT CONTRAST TECHNIQUE: Contiguous axial images were obtained from the base of the skull through the vertex without intravenous contrast. RADIATION DOSE REDUCTION: This exam was performed according to the departmental dose-optimization program which includes automated exposure control, adjustment of the mA and/or kV according to patient size and/or use of iterative reconstruction technique. COMPARISON:  None Available. FINDINGS: Brain: Cerebral volume within normal limits. No acute intracranial hemorrhage. No acute large vessel territory infarct. No mass lesion, midline shift or mass effect. Ventricles normal size without hydrocephalus. No extra-axial fluid collection. Vascular: No abnormal hyperdense vessel. Skull: Scalp soft tissues within normal limits.  Calvarium intact. Sinuses/Orbits: Globes and orbital soft tissues within normal limits, although better evaluated on corresponding CT of the orbits. Scattered mucosal thickening noted about the sphenoethmoidal and maxillary sinuses. Other: Moderate bilateral mastoid effusions noted, of uncertain significance. Visualized nasopharynx unremarkable. IMPRESSION: 1. Normal head CT.  No acute intracranial abnormality. 2. Moderate bilateral mastoid effusions, of uncertain significance. Correlation with physical exam suggested. Electronically Signed   By: Rise Mu M.D.   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 Start Atropine 1% bid, as well as Pred Forte  1% q3h. Agree with IV penicillin. I do not recommend that patient be admitted to [facility] system. Would recommend ED to ED transfer with [facility] where retina and uveitis service available for evaluation.   Please call with any questions.  Sinda Du MD [facility] Ophthalmology [phone]

## 2022-06-22 NOTE — Progress Notes (Signed)
ID Brief note   Attempted to see patient * 2, patient in MRI with multiple MRI ordered.   Was called by EDP Dr Eloise Harman for concerns of RPR elevated to 1:2. Prior RPR all negative including last in 11/2019.   In brief   31 Y O female with PMH of substance abuse who presented to ED with complaints of bilateral vision loss. Also Rt sided sensory deficits/RT leg weakness.   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 If there is concerns for delay in LP, would start IV pen G for possible neurosyphilis  T pallidum abs Ophthalmology and Neurology has been consulted per EDP  Full consult note to follow after in person evaluation  D/w Dr Eloise Harman  Odette Fraction, MD Infectious Disease Physician [facility] [address] [place], [place] [address] Phone: [phone]  Fax: [phone]

## 2022-06-22 NOTE — Consult Note (Incomplete)
[facility]                                                                                        Patient Identification: Patient Name: Meredith Vega MRN: [id number] Admit Date: 06/21/2022 11:35 PM Today's Date: 06/22/2022 Reason for consult: Concern for neurosyphilis Requesting provider: Rolly Pancake  Active Problems:   * No active hospital problems. *   Antibiotics:  None   Lines/Hardware:  Assessment   Recommendations      Rest of the management as per the primary team. Please call with questions or concerns.  Thank you for the consult  Odette Fraction, MD Infectious Disease Physician [facility] [address] [place], [place] [address] Phone: [phone]  Fax: [phone]  __________________________________________________________________________________________________________ HPI and Hospital Course: 31 year old female with PMH as below who presented to the ED on 12/26 with loss of vision in her left eye and half vision loss in her right eye for a week with dizziness.  She can only make out shapes out of her left eye however her right eyes she can only make out shapes but it is only half of her eye, no recent eye injuries, wearing glasses.  Denies any headache, paresthesia or weakness of the upper or lower extremities.  She also had subjective fever and chills denies any back pain or neck pain.  She is here on occasionally, denies using IV.  She has also noticed decreased sensation in the right side of her body since last few days and feels her right leg is weak.   ROS: General- Denies fever, chills, loss of appetite and loss of weight HEENT - Denies headache, blurry vision, neck pain, sinus pain Chest - Denies any chest pain, SOB or cough CVS- Denies any dizziness/lightheadedness, syncopal attacks,  palpitations Abdomen- Denies any nausea, vomiting, abdominal pain, hematochezia and diarrhea Neuro - Denies any weakness, numbness, tingling sensation Psych - Denies any changes in mood irritability or depressive symptoms GU- Denies any burning, dysuria, hematuria or increased frequency of urination Skin - denies any rashes/lesions MSK - denies any joint pain/swelling or restricted ROM   Past Medical History:  Diagnosis Date   Asthma    regular inhaler use   Polysubstance abuse (HCC) 2021   Past Surgical History:  Procedure Laterality Date   WISDOM TOOTH EXTRACTION      Scheduled Meds:  atropine  1 drop Both Eyes BID   prednisoLONE acetate  1 drop Left Eye Q3H   Continuous Infusions: PRN Meds:.  Allergies  Allergen Reactions   Ceclor [Cefaclor] Hives   Social History   Socioeconomic History   Marital status: Single    Spouse name: Not on file   Number of children: Not on file   Years of education: Not on file   Highest education level: Not on file  Occupational History   Not on file  Tobacco Use   Smoking status: Some Days    Packs/day: 1.00    Types: Cigarettes   Smokeless tobacco: Never   Tobacco comments:    cutting back  Vaping Use  Vaping Use: Never used  Substance and Sexual Activity   Alcohol use: No   Drug use: Yes    Types: Marijuana, Heroin, Cocaine    Comment: last cocaine use in september;  heroin use 10/28   Sexual activity: Yes    Birth control/protection: None  Other Topics Concern   Not on file  Social History Narrative   Pt reports that she and her partner have recently bought a car, but prior to that they had difficulty with transportation.  Getting a car has helped significantly with this challenge   Social Determinants of Health   Financial Resource Strain: Low Risk  (04/24/2018)   Overall Financial Resource Strain (CARDIA)    Difficulty of Paying Living Expenses: Not very hard  Food Insecurity: Not on file  Transportation Needs:  Unmet Transportation Needs (04/24/2018)   PRAPARE - Administrator, Civil Service (Medical): Yes    Lack of Transportation (Non-Medical): Yes  Physical Activity: Not on file  Stress: Not on file  Social Connections: Not on file  Intimate Partner Violence: Not At Risk (04/24/2018)   Humiliation, Afraid, Rape, and Kick questionnaire    Fear of Current or Ex-Partner: No    Emotionally Abused: No    Physically Abused: No    Sexually Abused: No   Family History  Problem Relation Age of Onset   Cirrhosis Mother    Vitals BP 100/77   Pulse (!) 56   Temp (!) 97.1 F (36.2 C) (Oral)   Resp 18   SpO2 98%   Physical Exam Constitutional:      Comments:   Cardiovascular:     Rate and Rhythm: Normal rate and regular rhythm.     Heart sounds: No murmur heard.   Pulmonary:     Effort: Pulmonary effort is normal.     Comments:   Abdominal:     Palpations: Abdomen is soft.     Tenderness:   Musculoskeletal:        General: No swelling or tenderness.   Skin:    Comments:   Neurological:     General: No focal deficit present.   Psychiatric:        Mood and Affect: Mood normal.    Pertinent Microbiology Results for orders placed or performed during the hospital encounter of 12/04/19  SARS CORONAVIRUS 2 (TAT 6-24 HRS) Nasopharyngeal Nasopharyngeal Swab     Status: None   Collection Time: 12/05/19 12:30 AM   Specimen: Nasopharyngeal Swab  Result Value Ref Range Status   SARS Coronavirus 2 NEGATIVE NEGATIVE Final    Comment: (NOTE) SARS-CoV-2 target nucleic acids are NOT DETECTED.  The SARS-CoV-2 RNA is generally detectable in upper and lower respiratory specimens during the acute phase of infection. Negative results do not preclude SARS-CoV-2 infection, do not rule out co-infections with other pathogens, and should not be used as the sole basis for treatment or other patient management decisions. Negative results must be combined with clinical  observations, patient history, and epidemiological information. The expected result is Negative.  Fact Sheet for Patients: HairSlick.no  Fact Sheet for Healthcare Providers: quierodirigir.com  This test is not yet approved or cleared by the [place] FDA and  has been authorized for detection and/or diagnosis of SARS-CoV-2 by FDA under an Emergency Use Authorization (EUA). This EUA will remain  in effect (meaning this test can be used) for the duration of the COVID-19 declaration under Se ction 564(b)(1) of the Act, 21 U.S.C. section 360bbb-3(b)(1),  unless the authorization is terminated or revoked sooner.  Performed at [facility], [address] [place], [place] [address]     Pertinent Lab seen by me:    Latest Ref Rng & Units 06/22/2022    1:01 AM 12/05/2019    3:47 AM 04/25/2018    7:10 AM  CBC  WBC 4.0 - 10.5 K/uL 8.1  23.1  13.8   Hemoglobin 12.0 - 15.0 g/dL 60.412.6  9.3  54.011.4   Hematocrit 36.0 - 46.0 % 39.7  27.3  34.3   Platelets 150 - 400 K/uL 474  219  229       Latest Ref Rng & Units 06/22/2022    1:01 AM 04/24/2018    4:02 PM 04/09/2018    5:17 PM  CMP  Glucose 70 - 99 mg/dL 94  93  [id number]   BUN 6 - 20 mg/dL 15  11  10    Creatinine 0.44 - 1.00 mg/dL 1.910.85  4.780.76  2.950.72   Sodium 135 - 145 mmol/L 137  136  141   Potassium 3.5 - 5.1 mmol/L 3.4  4.0  4.3   Chloride 98 - 111 mmol/L 102  104  105   CO2 22 - 32 mmol/L 26  22  19    Calcium 8.9 - 10.3 mg/dL 9.4  8.8  9.7   Total Protein 6.5 - 8.1 g/dL  6.7  6.3   Total Bilirubin 0.3 - 1.2 mg/dL  0.3  <6.2<0.2   Alkaline Phos 38 - 126 U/L  131  156   AST 15 - 41 U/L  16  12   ALT 0 - 44 U/L  15  12     Pertinent Imagings/Other Imagings Plain films and CT images have been personally visualized and interpreted; radiology reports have been reviewed. Decision making incorporated into the Impression / Recommendations.  MR BRAIN W WO CONTRAST  Result Date:  06/22/2022 CLINICAL DATA:  Neuro deficit, acute, stroke suspected; Vision loss, binocular EXAM: MRI HEAD AND ORBITS WITHOUT AND WITH CONTRAST TECHNIQUE: Multiplanar, multiecho pulse sequences of the brain and surrounding structures were obtained without and with intravenous contrast. Multiplanar, multiecho pulse sequences of the orbits and surrounding structures were obtained including fat saturation techniques, before and after intravenous contrast administration. CONTRAST:  5mL GADAVIST GADOBUTROL 1 MMOL/ML IV SOLN COMPARISON:  CT head and CT orbits from the same day. FINDINGS: Mildly motion limited studies. MRI HEAD FINDINGS Brain: No acute infarction, hemorrhage, hydrocephalus, extra-axial collection or mass lesion. No pathologic enhancement. Vascular: Major arterial flow voids are maintained. Skull and upper cervical spine: Normal marrow signal. Other: Moderate bilateral mastoid effusions. MRI ORBITS FINDINGS Orbits: No traumatic or inflammatory finding. Globes, optic nerves, orbital fat, extraocular muscles, vascular structures, and lacrimal glands are normal. Visualized sinuses: Mild-to-moderate paranasal sinus mucosal thickening. Soft tissues: Negative. IMPRESSION: 1. No evidence of acute intracranial or intraorbital abnormality. 2. Mild-to-moderate paranasal sinus mucosal thickening and moderate mastoid effusions. Electronically Signed   By: Feliberto HartsFrederick S Jones M.D.   On: 06/22/2022 08:30   MR ORBITS W WO CONTRAST  Result Date: 06/22/2022 CLINICAL DATA:  Neuro deficit, acute, stroke suspected; Vision loss, binocular EXAM: MRI HEAD AND ORBITS WITHOUT AND WITH CONTRAST TECHNIQUE: Multiplanar, multiecho pulse sequences of the brain and surrounding structures were obtained without and with intravenous contrast. Multiplanar, multiecho pulse sequences of the orbits and surrounding structures were obtained including fat saturation techniques, before and after intravenous contrast administration. CONTRAST:  5mL  GADAVIST GADOBUTROL 1 MMOL/ML  IV SOLN COMPARISON:  CT head and CT orbits from the same day. FINDINGS: Mildly motion limited studies. MRI HEAD FINDINGS Brain: No acute infarction, hemorrhage, hydrocephalus, extra-axial collection or mass lesion. No pathologic enhancement. Vascular: Major arterial flow voids are maintained. Skull and upper cervical spine: Normal marrow signal. Other: Moderate bilateral mastoid effusions. MRI ORBITS FINDINGS Orbits: No traumatic or inflammatory finding. Globes, optic nerves, orbital fat, extraocular muscles, vascular structures, and lacrimal glands are normal. Visualized sinuses: Mild-to-moderate paranasal sinus mucosal thickening. Soft tissues: Negative. IMPRESSION: 1. No evidence of acute intracranial or intraorbital abnormality. 2. Mild-to-moderate paranasal sinus mucosal thickening and moderate mastoid effusions. Electronically Signed   By: Feliberto Harts M.D.   On: 06/22/2022 08:30   CT Orbits W Contrast  Result Date: 06/22/2022 CLINICAL DATA:  Initial evaluation for monocular visual loss, left eye, partial visual loss in right eye for 1 week. EXAM: CT ORBITS WITH CONTRAST TECHNIQUE: Multidetector CT images was performed according to the standard protocol following intravenous contrast administration. RADIATION DOSE REDUCTION: This exam was performed according to the departmental dose-optimization program which includes automated exposure control, adjustment of the mA and/or kV according to patient size and/or use of iterative reconstruction technique. CONTRAST:  50mL OMNIPAQUE IOHEXOL 300 MG/ML  SOLN COMPARISON:  None Available. FINDINGS: Orbits: Globes and are symmetric in size with normal appearance and morphology. Optic nerves symmetric and within normal limits. No visible inflammatory changes about either optic nerve sheath complex to suggest acute optic neuritis. Intraconal and extraconal fat maintained. Extra-ocular muscles symmetric and within normal limits.  Lacrimal glands normal. No abnormality about the orbital apices or cavernous sinus. Superior orbital veins symmetric and within normal limits. No visible sellar or suprasellar abnormality. Visible paranasal sinuses: Scattered mucoperiosteal thickening present about the sphenoid ethmoidal and maxillary sinuses. Moderate bilateral mastoid effusions noted. Soft tissues: Periorbital soft tissues demonstrate no acute inflammatory changes or other abnormality. Remainder of the visualized soft tissues of the face are within normal limits. Osseous: No acute osseous finding.  Bony orbits intact. Limited intracranial: Unremarkable. IMPRESSION: 1. Normal CT of the orbits. No findings to explain patient's symptoms identified. 2. Mild-to-moderate paranasal sinus disease, likely allergic/inflammatory in nature. 3. Moderate bilateral mastoid effusions. Of uncertain significance. Correlation with physical exam suggested. Electronically Signed   By: Rise Mu M.D.   On: 06/22/2022 03:49   CT Head Wo Contrast  Result Date: 06/22/2022 CLINICAL DATA:  Initial evaluation for monocular visual loss, left side. EXAM: CT HEAD WITHOUT CONTRAST TECHNIQUE: Contiguous axial images were obtained from the base of the skull through the vertex without intravenous contrast. RADIATION DOSE REDUCTION: This exam was performed according to the departmental dose-optimization program which includes automated exposure control, adjustment of the mA and/or kV according to patient size and/or use of iterative reconstruction technique. COMPARISON:  None Available. FINDINGS: Brain: Cerebral volume within normal limits. No acute intracranial hemorrhage. No acute large vessel territory infarct. No mass lesion, midline shift or mass effect. Ventricles normal size without hydrocephalus. No extra-axial fluid collection. Vascular: No abnormal hyperdense vessel. Skull: Scalp soft tissues within normal limits.  Calvarium intact. Sinuses/Orbits: Globes  and orbital soft tissues within normal limits, although better evaluated on corresponding CT of the orbits. Scattered mucosal thickening noted about the sphenoethmoidal and maxillary sinuses. Other: Moderate bilateral mastoid effusions noted, of uncertain significance. Visualized nasopharynx unremarkable. IMPRESSION: 1. Normal head CT.  No acute intracranial abnormality. 2. Moderate bilateral mastoid effusions, of uncertain significance. Correlation with physical exam suggested. Electronically Signed   By:  Rise Mu M.D.   On: 06/22/2022 03:43     I spent 100  minutes for this patient encounter including review of prior medical records/discussing diagnostics and treatment plan with the patient/family/coordinate care with primary/other specialits with greater than 50% of time in face to face encounter.   Electronically signed by:   Odette Fraction, MD Infectious Disease Physician [facility] Pager: [phone]

## 2022-06-23 LAB — T.PALLIDUM AB, TOTAL: T Pallidum Abs: REACTIVE — AB

## 2022-06-23 NOTE — ED Notes (Signed)
[facility] arrived @0033 . This RN gave report to [facility]. [facility] called report at shift change and it was an ED to ED transfer. This RN clarified with [facility] and they are aware she's coming. [facility] agreed to give an updated report to .

## 2022-06-24 LAB — VDRL, CSF: VDRL Quant, CSF: NONREACTIVE

## 2022-06-26 LAB — CSF CULTURE W GRAM STAIN
Culture: NO GROWTH
Gram Stain: NONE SEEN

## 2022-06-27 LAB — CULTURE, BLOOD (ROUTINE X 2): Culture: NO GROWTH
